# Patient Record
Sex: Male | Born: 1935 | Race: Black or African American | Hispanic: No | Marital: Married | Smoking: Never smoker
Health system: Southern US, Community
[De-identification: ages and names within clinical notes are randomized; demographics above are authoritative.]

## PROBLEM LIST (undated history)

## (undated) ENCOUNTER — Ambulatory Visit (HOSPITAL_COMMUNITY): Admission: EM | Discharge status: Absent without leave | Payer: Medicare Other

## (undated) DIAGNOSIS — G8929 Other chronic pain: Secondary | ICD-10-CM

## (undated) DIAGNOSIS — G459 Transient cerebral ischemic attack, unspecified: Secondary | ICD-10-CM

## (undated) DIAGNOSIS — M109 Gout, unspecified: Secondary | ICD-10-CM

## (undated) DIAGNOSIS — M545 Low back pain, unspecified: Secondary | ICD-10-CM

## (undated) DIAGNOSIS — E7800 Pure hypercholesterolemia, unspecified: Secondary | ICD-10-CM

## (undated) DIAGNOSIS — I10 Essential (primary) hypertension: Secondary | ICD-10-CM

## (undated) DIAGNOSIS — I499 Cardiac arrhythmia, unspecified: Secondary | ICD-10-CM

## (undated) DIAGNOSIS — I739 Peripheral vascular disease, unspecified: Secondary | ICD-10-CM

## (undated) DIAGNOSIS — R7303 Prediabetes: Secondary | ICD-10-CM

## (undated) DIAGNOSIS — J309 Allergic rhinitis, unspecified: Secondary | ICD-10-CM

## (undated) DIAGNOSIS — N529 Male erectile dysfunction, unspecified: Secondary | ICD-10-CM

## (undated) DIAGNOSIS — I509 Heart failure, unspecified: Secondary | ICD-10-CM

## (undated) DIAGNOSIS — I6529 Occlusion and stenosis of unspecified carotid artery: Secondary | ICD-10-CM

## (undated) DIAGNOSIS — E119 Type 2 diabetes mellitus without complications: Secondary | ICD-10-CM

## (undated) DIAGNOSIS — E876 Hypokalemia: Secondary | ICD-10-CM

## (undated) DIAGNOSIS — I5043 Acute on chronic combined systolic (congestive) and diastolic (congestive) heart failure: Secondary | ICD-10-CM

## (undated) DIAGNOSIS — M1990 Unspecified osteoarthritis, unspecified site: Secondary | ICD-10-CM

## (undated) DIAGNOSIS — I4891 Unspecified atrial fibrillation: Secondary | ICD-10-CM

## (undated) HISTORY — DX: Occlusion and stenosis of unspecified carotid artery: I65.29

## (undated) HISTORY — DX: Transient cerebral ischemic attack, unspecified: G45.9

## (undated) HISTORY — DX: Essential (primary) hypertension: I10

## (undated) HISTORY — DX: Male erectile dysfunction, unspecified: N52.9

## (undated) HISTORY — PX: KNEE ARTHROSCOPY: SHX127

## (undated) HISTORY — PX: TONSILLECTOMY: SUR1361

## (undated) HISTORY — DX: Hypokalemia: E87.6

## (undated) HISTORY — PX: CATARACT EXTRACTION W/ INTRAOCULAR LENS IMPLANT: SHX1309

## (undated) HISTORY — DX: Allergic rhinitis, unspecified: J30.9

## (undated) HISTORY — PX: INGUINAL HERNIA REPAIR: SUR1180

## (undated) HISTORY — DX: Pure hypercholesterolemia, unspecified: E78.00

## (undated) HISTORY — PX: LUMBAR DISC SURGERY: SHX700

## (undated) HISTORY — DX: Type 2 diabetes mellitus without complications: E11.9

## (undated) HISTORY — PX: BACK SURGERY: SHX140

---

## 2000-11-24 ENCOUNTER — Encounter: Payer: Self-pay | Admitting: Emergency Medicine

## 2000-11-24 ENCOUNTER — Emergency Department (HOSPITAL_COMMUNITY): Admission: EM | Admit: 2000-11-24 | Discharge: 2000-11-24 | Payer: Self-pay | Admitting: Emergency Medicine

## 2001-02-22 ENCOUNTER — Emergency Department (HOSPITAL_COMMUNITY): Admission: EM | Admit: 2001-02-22 | Discharge: 2001-02-22 | Payer: Self-pay | Admitting: Emergency Medicine

## 2001-06-01 ENCOUNTER — Encounter (HOSPITAL_BASED_OUTPATIENT_CLINIC_OR_DEPARTMENT_OTHER): Payer: Self-pay | Admitting: General Surgery

## 2001-06-05 ENCOUNTER — Encounter (INDEPENDENT_AMBULATORY_CARE_PROVIDER_SITE_OTHER): Payer: Self-pay | Admitting: *Deleted

## 2001-06-05 ENCOUNTER — Ambulatory Visit (HOSPITAL_COMMUNITY): Admission: RE | Admit: 2001-06-05 | Discharge: 2001-06-05 | Payer: Self-pay | Admitting: General Surgery

## 2004-04-23 ENCOUNTER — Ambulatory Visit: Payer: Self-pay | Admitting: Endocrinology

## 2005-03-18 ENCOUNTER — Ambulatory Visit: Payer: Self-pay | Admitting: Endocrinology

## 2005-04-20 ENCOUNTER — Ambulatory Visit: Payer: Self-pay | Admitting: Endocrinology

## 2005-05-10 ENCOUNTER — Ambulatory Visit: Payer: Self-pay | Admitting: Endocrinology

## 2005-06-21 ENCOUNTER — Ambulatory Visit: Payer: Self-pay | Admitting: Endocrinology

## 2005-10-12 ENCOUNTER — Ambulatory Visit: Payer: Self-pay | Admitting: Endocrinology

## 2006-07-19 ENCOUNTER — Ambulatory Visit: Payer: Self-pay | Admitting: Endocrinology

## 2007-01-25 ENCOUNTER — Encounter: Payer: Self-pay | Admitting: *Deleted

## 2007-01-25 DIAGNOSIS — J309 Allergic rhinitis, unspecified: Secondary | ICD-10-CM

## 2007-01-25 HISTORY — DX: Allergic rhinitis, unspecified: J30.9

## 2007-03-30 ENCOUNTER — Ambulatory Visit: Payer: Self-pay | Admitting: Endocrinology

## 2007-04-12 ENCOUNTER — Encounter: Payer: Self-pay | Admitting: Endocrinology

## 2007-04-12 ENCOUNTER — Ambulatory Visit: Payer: Self-pay

## 2007-04-12 DIAGNOSIS — I6529 Occlusion and stenosis of unspecified carotid artery: Secondary | ICD-10-CM | POA: Insufficient documentation

## 2007-04-12 HISTORY — DX: Occlusion and stenosis of unspecified carotid artery: I65.29

## 2007-04-19 ENCOUNTER — Ambulatory Visit: Payer: Self-pay | Admitting: Vascular Surgery

## 2007-04-19 ENCOUNTER — Encounter: Payer: Self-pay | Admitting: Endocrinology

## 2008-06-19 ENCOUNTER — Ambulatory Visit: Payer: Self-pay | Admitting: Endocrinology

## 2008-06-19 DIAGNOSIS — I10 Essential (primary) hypertension: Secondary | ICD-10-CM

## 2008-06-19 DIAGNOSIS — E7800 Pure hypercholesterolemia, unspecified: Secondary | ICD-10-CM

## 2008-06-19 DIAGNOSIS — N529 Male erectile dysfunction, unspecified: Secondary | ICD-10-CM

## 2008-06-19 HISTORY — DX: Male erectile dysfunction, unspecified: N52.9

## 2008-06-19 HISTORY — DX: Pure hypercholesterolemia, unspecified: E78.00

## 2008-06-19 HISTORY — DX: Essential (primary) hypertension: I10

## 2008-06-19 LAB — CONVERTED CEMR LAB
ALT: 13 U/L
AST: 17 U/L
Albumin: 3.1 g/dL — ABNORMAL LOW
Alkaline Phosphatase: 71 U/L
BUN: 11 mg/dL
Bilirubin, Direct: 0.1 mg/dL
CO2: 34 meq/L — ABNORMAL HIGH
Calcium: 9.1 mg/dL
Chloride: 105 meq/L
Cholesterol: 236 mg/dL
Creatinine, Ser: 0.9 mg/dL
Direct LDL: 139.3 mg/dL
GFR calc Af Amer: 106 mL/min
GFR calc non Af Amer: 88 mL/min
Glucose, Bld: 92 mg/dL
HDL: 30.9 mg/dL — ABNORMAL LOW
Potassium: 3.2 meq/L — ABNORMAL LOW
Sodium: 145 meq/L
TSH: 1.89 u[IU]/mL
Total Bilirubin: 0.8 mg/dL
Total CHOL/HDL Ratio: 7.6
Total Protein: 6.5 g/dL
Triglycerides: 193 mg/dL — ABNORMAL HIGH
VLDL: 39 mg/dL

## 2008-06-26 ENCOUNTER — Encounter: Payer: Self-pay | Admitting: Endocrinology

## 2008-06-26 DIAGNOSIS — G459 Transient cerebral ischemic attack, unspecified: Secondary | ICD-10-CM | POA: Insufficient documentation

## 2008-06-26 HISTORY — DX: Transient cerebral ischemic attack, unspecified: G45.9

## 2008-07-24 ENCOUNTER — Ambulatory Visit: Payer: Self-pay | Admitting: Endocrinology

## 2008-07-24 DIAGNOSIS — E876 Hypokalemia: Secondary | ICD-10-CM

## 2008-07-24 DIAGNOSIS — E119 Type 2 diabetes mellitus without complications: Secondary | ICD-10-CM

## 2008-07-24 HISTORY — DX: Hypokalemia: E87.6

## 2008-07-24 HISTORY — DX: Type 2 diabetes mellitus without complications: E11.9

## 2008-07-24 LAB — CONVERTED CEMR LAB
ALT: 15 units/L (ref 0–53)
AST: 19 units/L (ref 0–37)
Albumin: 3.5 g/dL (ref 3.5–5.2)
Alkaline Phosphatase: 74 units/L (ref 39–117)
BUN: 13 mg/dL (ref 6–23)
Bilirubin, Direct: 0.2 mg/dL (ref 0.0–0.3)
CO2: 35 meq/L — ABNORMAL HIGH (ref 19–32)
Calcium: 9.3 mg/dL (ref 8.4–10.5)
Chloride: 97 meq/L (ref 96–112)
Cholesterol: 184 mg/dL (ref 0–200)
Creatinine, Ser: 1.1 mg/dL (ref 0.4–1.5)
GFR calc Af Amer: 84 mL/min
GFR calc non Af Amer: 70 mL/min
Glucose, Bld: 88 mg/dL (ref 70–99)
HDL: 36.4 mg/dL — ABNORMAL LOW (ref >39.0)
Hgb A1c MFr Bld: 6.1 % — ABNORMAL HIGH (ref 4.6–6.0)
LDL Cholesterol: 121 mg/dL — ABNORMAL HIGH (ref 0–99)
PSA: 2.78 ng/mL (ref 0.10–4.00)
Potassium: 2.7 meq/L — CL (ref 3.5–5.1)
Sodium: 141 meq/L (ref 135–145)
Total Bilirubin: 1.1 mg/dL (ref 0.3–1.2)
Total CHOL/HDL Ratio: 5.1
Total Protein: 7.2 g/dL (ref 6.0–8.3)
Triglycerides: 132 mg/dL (ref 0–149)
VLDL: 26 mg/dL (ref 0–40)

## 2008-07-25 ENCOUNTER — Telehealth: Payer: Self-pay | Admitting: Endocrinology

## 2008-08-05 ENCOUNTER — Telehealth: Payer: Self-pay | Admitting: Endocrinology

## 2008-12-19 ENCOUNTER — Ambulatory Visit: Payer: Self-pay | Admitting: Endocrinology

## 2008-12-19 LAB — CONVERTED CEMR LAB
BUN: 14 mg/dL (ref 6–23)
Bilirubin Urine: NEGATIVE
CO2: 33 meq/L — ABNORMAL HIGH (ref 19–32)
Calcium: 9.1 mg/dL (ref 8.4–10.5)
Chloride: 96 meq/L (ref 96–112)
Creatinine, Ser: 1.1 mg/dL (ref 0.4–1.5)
Creatinine,U: 358.9 mg/dL
GFR calc non Af Amer: 84.09 mL/min (ref >60)
Glucose, Bld: 88 mg/dL (ref 70–99)
Hgb A1c MFr Bld: 6.3 % (ref 4.6–6.5)
Ketones, ur: NEGATIVE mg/dL
Microalb Creat Ratio: 71.1 mg/g — ABNORMAL HIGH (ref 0.0–30.0)
Microalb, Ur: 25.5 mg/dL — ABNORMAL HIGH (ref 0.0–1.9)
Nitrite: NEGATIVE
Potassium: 3.1 meq/L — ABNORMAL LOW (ref 3.5–5.1)
Sodium: 138 meq/L (ref 135–145)
Specific Gravity, Urine: >=1.03 (ref 1.000–1.030)
Total Protein, Urine: 100 mg/dL
Urine Glucose: NEGATIVE mg/dL
Urobilinogen, UA: 0.2 (ref 0.0–1.0)
pH: 6 (ref 5.0–8.0)

## 2009-10-02 ENCOUNTER — Ambulatory Visit: Payer: Self-pay | Admitting: Endocrinology

## 2009-10-02 LAB — CONVERTED CEMR LAB
ALT: 20 units/L (ref 0–53)
AST: 21 units/L (ref 0–37)
Albumin: 3.9 g/dL (ref 3.5–5.2)
Alkaline Phosphatase: 78 units/L (ref 39–117)
BUN: 10 mg/dL (ref 6–23)
Basophils Absolute: 0 10*3/uL (ref 0.0–0.1)
Basophils Relative: 0.5 % (ref 0.0–3.0)
Bilirubin, Direct: 0.2 mg/dL (ref 0.0–0.3)
CO2: 33 meq/L — ABNORMAL HIGH (ref 19–32)
Calcium: 9.7 mg/dL (ref 8.4–10.5)
Chloride: 99 meq/L (ref 96–112)
Cholesterol: 262 mg/dL — ABNORMAL HIGH (ref 0–200)
Creatinine, Ser: 1.1 mg/dL (ref 0.4–1.5)
Direct LDL: 157.8 mg/dL
Eosinophils Absolute: 0.1 10*3/uL (ref 0.0–0.7)
Eosinophils Relative: 1.8 % (ref 0.0–5.0)
GFR calc non Af Amer: 83.91 mL/min (ref >60)
Glucose, Bld: 94 mg/dL (ref 70–99)
HCT: 47.3 % (ref 39.0–52.0)
HDL: 38.2 mg/dL — ABNORMAL LOW (ref >39.00)
Hemoglobin: 15.9 g/dL (ref 13.0–17.0)
Hgb A1c MFr Bld: 6 % (ref 4.6–6.5)
Lymphocytes Relative: 33.2 % (ref 12.0–46.0)
Lymphs Abs: 2.2 10*3/uL (ref 0.7–4.0)
MCHC: 33.6 g/dL (ref 30.0–36.0)
MCV: 83.5 fL (ref 78.0–100.0)
Monocytes Absolute: 0.6 10*3/uL (ref 0.1–1.0)
Monocytes Relative: 8.6 % (ref 3.0–12.0)
Neutro Abs: 3.6 10*3/uL (ref 1.4–7.7)
Neutrophils Relative %: 55.9 % (ref 43.0–77.0)
PSA: 3.55 ng/mL (ref 0.10–4.00)
Platelets: 196 10*3/uL (ref 150.0–400.0)
Potassium: 3.5 meq/L (ref 3.5–5.1)
RBC: 5.67 M/uL (ref 4.22–5.81)
RDW: 15.9 % — ABNORMAL HIGH (ref 11.5–14.6)
Sodium: 140 meq/L (ref 135–145)
TSH: 1.36 microintl units/mL (ref 0.35–5.50)
Total Bilirubin: 1 mg/dL (ref 0.3–1.2)
Total CHOL/HDL Ratio: 7
Total Protein: 6.9 g/dL (ref 6.0–8.3)
Triglycerides: 193 mg/dL — ABNORMAL HIGH (ref 0.0–149.0)
VLDL: 38.6 mg/dL (ref 0.0–40.0)
WBC: 6.5 10*3/uL (ref 4.5–10.5)

## 2010-07-07 NOTE — Assessment & Plan Note (Signed)
Summary: f/u appt per wife/#/cd   Vital Signs:  Patient profile:   77 year old male Height:      65 inches (165.10 cm) Weight:      200.50 pounds (91.14 kg) BMI:     33.49 O2 Sat:      96 % on Room air Temp:     97.2 degrees F (36.22 degrees C) oral Pulse rate:   99 / minute BP sitting:   102 / 60  (left arm) Cuff size:   large  Vitals Entered By: Josph Macho RMA (October 02, 2009 1:33 PM)  O2 Flow:  Room air CC: Follow-up visit/ Pt states he is no longer taking Viagra, Simvastatin, or Klor Con/ CF Is Patient Diabetic? Yes   CC:  Follow-up visit/ Pt states he is no longer taking Viagra, Simvastatin, and or Klor Con/ CF.  History of Present Illness: the status of at least 3 ongoing medical problems is addressed today: hypokalemia:  pt does not take klor.  denies cramps. dm:  he does not check cbg's.  pt states he feels well in general. dyslipidemia:  he does not take his zocor.  he is working on his diet. htn:  he takes and tolerates zestoretic well.  Current Medications (verified): 1)  Zestoretic 20-12.5 Mg Tabs (Lisinopril-Hydrochlorothiazide) .... Take 2 By Mouth Once Daily 2)  Viagra 100 Mg Tabs (Sildenafil Citrate) .... Prn 3)  Simvastatin 80 Mg Tabs (Simvastatin) .... Qhs 4)  Klor-Con M20 20 Meq Cr-Tabs (Potassium Chloride Crys Cr) .... Qd 5)  Aspirin 81mg  .... Once Daily  Allergies (verified): 1)  ! 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    Physical Exam  General:  normal appearance.   Lungs:  Clear to auscultation bilaterally. Normal respiratory effort.  Heart:  Regular rate and rhythm without murmurs or gallops noted. Normal S1,S2.     Additional Exam:  Potassium                 3.5 mEq/L  Hemoglobin A1C            6.0 %                       4.6-6.5 Cholesterol LDL   157.8 mg/dL   Impression & Recommendations:  Problem # 1:  DM (ICD-250.00) well-controlled  Problem # 2:  HYPOKALEMIA (ICD-276.8) reduction of lisinopril/hctz will help this  Problem # 3:  HYPERCHOLESTEROLEMIA (ICD-272.0) needs increased rx  Problem # 4:  HYPERTENSION (ICD-401.9) overcontrolled  Medications Added to Medication List This Visit: 1)  Zestoretic 20-12.5 Mg Tabs (Lisinopril-hydrochlorothiazide) .... Take 1 by mouth once daily 2)  Aspirin 81mg   .... Once daily  Other Orders: TLB-Lipid Panel (80061-LIPID) TLB-BMP (Basic Metabolic Panel-BMET) (80048-METABOL) TLB-CBC Platelet - w/Differential (85025-CBCD) TLB-Hepatic/Liver Function Pnl (80076-HEPATIC) TLB-TSH (Thyroid Stimulating Hormone) (84443-TSH) TLB-A1C / Hgb A1C (Glycohemoglobin) (83036-A1C) TLB-PSA (Prostate Specific Antigen) (84153-PSA) Est. Patient Level IV (16109) Prescription Created Electronically [phone])  Patient Instructions: 1)  tests are being ordered for you today.  a few days after the test(s), please call [phone] to hear your test results. 2)  pending the test results, please reduce lisinopril/hctz to 1 pill/day.  also, you should resume simvastatin 80 mg once daily. 3)  please schedule a physical  appointment soon. 4)  (update: i left message on phone-tree:  resume zocor). Prescriptions: SIMVASTATIN 80 MG TABS (SIMVASTATIN) qhs  #30 x 11   Entered and Authorized by:   Minus Breeding MD   Signed by:   Minus Breeding MD on 10/02/2009   Method used:   Electronically to        [facility] [phone]* (retail)       [address]       [place], [place]  [address]       Ph: [phone]       Fax: [phone]   RxID:   [id number] ZESTORETIC 20-12.5 MG TABS (LISINOPRIL-HYDROCHLOROTHIAZIDE) take 1 by mouth once daily  #30 x 11   Entered and Authorized  by:   Minus Breeding MD   Signed by:   Minus Breeding MD on 10/02/2009   Method used:   Electronically to        [facility] [phone]* (retail)       [address]       [place], [place]  [address]       Ph: [phone]       Fax: [phone]   RxID:   [id number]

## 2010-10-20 NOTE — Assessment & Plan Note (Signed)
OFFICE VISIT   Lucas [name], Lucas [name]  DOB:  09/10/1932                                       04/19/2007  ZOXWR#:[id number]   The patient is a 77 year old male referred by Dr. Romero Belling for  evaluation of asymptomatic carotid stenosis.  His atherosclerotic risk  factors include hypertension, elevated cholesterol, and smoking.  He  quit smoking 7 years ago.   He does have a history of a TIA in 1998 where he had a left hemiplegia,  which lasted approximately 5 seconds.  He has had no further events  since then.  He denies any history of amaurosis.   Denies history of coronary artery disease or diabetes.   PAST SURGICAL HISTORY:  He had a left knee operation, back surgery,  prostatectomy, and ventral hernia repair.   PAST MEDICAL HISTORY:  Otherwise, fairly unremarkable.   Medications include aspirin once a day, lisinopril hydrochlorothiazide  20/12.5 two daily, Pravachol 40 mg 2 p.o. nightly.   He is allergic to sulfa, which causes nausea.   FAMILY HISTORY:  Remarkable for his brother who had kidney problems and  his father who had a stroke.   SOCIAL HISTORY:  He is married.  Smoking history is as above.  He does  not consume alcohol regularly.   REVIEW OF SYSTEMS:  He has had some recent weight gain.  He is 5 feet 6  inches.  He had an irregular heartbeat when he was little, but has not had  problems with this since then.  He denies history of chest pain, asthma, wheezing, GI bleeding, renal  dysfunction, syncopal episodes, seizures, depression, anxiety, changes  in his eyesight, or bleeding or clotting disorders.  TIA history as listed above.   PHYSICAL EXAM:  Blood pressure 124/78 in the right arm, 153/85 in the  left arm, heart rate is 98 and regular.  HEENT is unremarkable.  He has  a left carotid bruit.  He has no right carotid bruit.  He has 2+ carotid  pulses.  He has 2+ brachial, radial, femoral, and dorsalis pedis pulses  bilaterally.   Chest is clear to auscultation.  Cardiac exam is regular  rate and rhythm without murmur.  Abdomen is obese, soft, nontender,  nondistended with normal bowel sounds.  Neurologic exam shows symmetric  upper extremity and lower extremity motor strength.  Sensation is  intact.   The patient had a carotid duplex exam performed on April 12, 2007 at  [facility].  This showed a high-grade left internal carotid  artery stenosis greater than 80% with an end diastolic velocity of 227  cm per second.  He had less than 40% stenosis of the right internal  carotid artery. Vertebral artery flow was antegrade bilaterally.   I believe the patient would benefit form left carotid endarterectomy for  stroke prophylaxis.  I discussed with him today the risks, benefits, and  possible complications, and procedure details of carotid endarterectomy.  I also discussed with him the stroke risk long term of medical therapy  alone versus stroke risk of carotid endarterectomy and the advantages of  this.  However, he does not wish to have carotid endarterectomy  scheduled at this time.  He states he would like to discuss this with  his primary care doctor first.  We can schedule him for his carotid  endarterectomy  at any point in the future.  He did have a cardiac stress  test within the last few weeks.  We would need to obtain this prior to  his carotid endarterectomy and make sure that he has no significant  ischemia.   Janetta Hora. Fields, MD  Electronically Signed   CEF/MEDQ  D:  04/19/2007  T:  04/20/2007  Job:  [id number]   cc:   Gregary Signs A. Everardo All, MD

## 2010-10-23 ENCOUNTER — Other Ambulatory Visit: Payer: Self-pay | Admitting: Endocrinology

## 2010-10-23 NOTE — Op Note (Signed)
[place]  Patient:    STAVROS, CAIL Visit Number: [id number] MRN: [id number]          Service Type: Attending:  Luisa Hart L. Lurene Shadow, M.D. Dictated by:   Mardene Celeste. Lurene Shadow, M.D. Proc. Date: 06/05/01                             Operative Report  PREOPERATIVE DIAGNOSIS:  Right inguinal hernia with scrotal component.  POSTOPERATIVE DIAGNOSIS:  Right inguinal hernia with scrotal component.  OPERATION PERFORMED:  Right inguinal herniorrhaphy with mesh.  SURGEON:  Mardene Celeste. Lurene Shadow, M.D.  ASSISTANT:  Nurse.  ANESTHESIA:  General.  INDICATIONS FOR PROCEDURE:  The patient is a 77 year old man presenting with a very large inguinal hernia which has extended down into the scrotum.  This hernia is however, reducible with some effort.  He was brought to the operating room for repair.  DESCRIPTION OF PROCEDURE:  Following the induction of satisfactory general anesthesia with the patient positioned supinely, the lower abdomen, scrotum, penis were prepped and draped to be included in a sterile operative field.  I infiltrated the right lower abdominal crease with 0.5% Marcaine with epinephrine and made a transverse incision in the lower abdominal crease carrying the dissection down to the external oblique aponeurosis.  Additional injections of Marcaine 0.5% with 1:200,000 epinephrine were used throughout the course of the procedure.  I opened up the external oblique aponeurosis with the external inguinal ring and retracted the ilioinguinal nerve cephalad and medially.  The entire spermatic cord and large sac were then elevated and held with a Penrose drain.  Dissection of the very large sac from out of the scrotum was carried out separating the sac from the cord and carrying the dissection out to the internal ring.  The sac was then doubly suture ligated with 2-0 silk sutures and amputated and forwarded for pathologic evaluation. The direct space was  then repaired with an onlay patch of Marlex mesh which was then sewn into the pubic tubercle and carried up along the conjoined tendon with a running 2-0 Novofil suture up to the internal ring and again from the pubic tubercle up along the shelving edge of Pouparts ligament up to the internal ring with the mesh being split so as to allow normal protrusion of the cord through the mesh.  The tails of the mesh were then trimmed and sutured down to internal oblique muscles.  The repair appeared to be intact. Sponge, instrument and sharp counts were verified.  The ilioinguinal nerve returned to its normal anatomic position and the external oblique aponeurosis closed over the spermatic cord with running 2-0 Vicryl suture.  Subcutaneous tissue were closed with a running 3-0 Vicryl suture and the skin was closed with a running 4-0 Monocryl and then reinforced with Steri-Strips.  Sterile dressing was applied.  Anesthetic reversed.  Patient removed from the operating room to the recovery room in stable condition having tolerated the procedure well. Dictated by:   Mardene Celeste. Lurene Shadow, M.D. Attending:  Mardene Celeste. Lurene Shadow, M.D. DD:  06/05/01 TD:  06/05/01 Job: [id number]

## 2010-12-10 ENCOUNTER — Other Ambulatory Visit: Payer: Self-pay | Admitting: Endocrinology

## 2011-02-08 ENCOUNTER — Other Ambulatory Visit: Payer: Self-pay | Admitting: Endocrinology

## 2011-03-01 ENCOUNTER — Ambulatory Visit (INDEPENDENT_AMBULATORY_CARE_PROVIDER_SITE_OTHER): Payer: Medicare Other | Admitting: Endocrinology

## 2011-03-01 ENCOUNTER — Other Ambulatory Visit (INDEPENDENT_AMBULATORY_CARE_PROVIDER_SITE_OTHER): Payer: Medicare Other

## 2011-03-01 ENCOUNTER — Other Ambulatory Visit: Payer: Self-pay

## 2011-03-01 ENCOUNTER — Encounter: Payer: Self-pay | Admitting: Endocrinology

## 2011-03-01 ENCOUNTER — Other Ambulatory Visit: Payer: Self-pay | Admitting: Endocrinology

## 2011-03-01 DIAGNOSIS — E7800 Pure hypercholesterolemia, unspecified: Secondary | ICD-10-CM

## 2011-03-01 DIAGNOSIS — Z125 Encounter for screening for malignant neoplasm of prostate: Secondary | ICD-10-CM

## 2011-03-01 DIAGNOSIS — R82998 Other abnormal findings in urine: Secondary | ICD-10-CM

## 2011-03-01 DIAGNOSIS — E119 Type 2 diabetes mellitus without complications: Secondary | ICD-10-CM

## 2011-03-01 DIAGNOSIS — Z79899 Other long term (current) drug therapy: Secondary | ICD-10-CM

## 2011-03-01 DIAGNOSIS — I10 Essential (primary) hypertension: Secondary | ICD-10-CM

## 2011-03-01 DIAGNOSIS — E876 Hypokalemia: Secondary | ICD-10-CM

## 2011-03-01 DIAGNOSIS — T887XXA Unspecified adverse effect of drug or medicament, initial encounter: Secondary | ICD-10-CM | POA: Insufficient documentation

## 2011-03-01 DIAGNOSIS — G459 Transient cerebral ischemic attack, unspecified: Secondary | ICD-10-CM

## 2011-03-01 DIAGNOSIS — R8281 Pyuria: Secondary | ICD-10-CM

## 2011-03-01 LAB — CBC WITH DIFFERENTIAL/PLATELET
Eosinophils Absolute: 0.1 10*3/uL (ref 0.0–0.7)
Eosinophils Relative: 1.5 % (ref 0.0–5.0)
Lymphocytes Relative: 31.6 % (ref 12.0–46.0)
MCV: 85.1 fl (ref 78.0–100.0)
Monocytes Absolute: 0.7 10*3/uL (ref 0.1–1.0)
Neutrophils Relative %: 55.8 % (ref 43.0–77.0)
Platelets: 162 10*3/uL (ref 150.0–400.0)
RBC: 5.63 Mil/uL (ref 4.22–5.81)
WBC: 6.2 10*3/uL (ref 4.5–10.5)

## 2011-03-01 LAB — URINALYSIS, ROUTINE W REFLEX MICROSCOPIC
Nitrite: NEGATIVE
Specific Gravity, Urine: 1.02 (ref 1.000–1.030)
Total Protein, Urine: NEGATIVE
Urobilinogen, UA: 0.2 (ref 0.0–1.0)

## 2011-03-01 LAB — HEPATIC FUNCTION PANEL
ALT: 15 U/L (ref 0–53)
Albumin: 3.6 g/dL (ref 3.5–5.2)
Bilirubin, Direct: 0.1 mg/dL (ref 0.0–0.3)
Total Protein: 6.6 g/dL (ref 6.0–8.3)

## 2011-03-01 LAB — MICROALBUMIN / CREATININE URINE RATIO
Creatinine,U: 249.2 mg/dL
Microalb Creat Ratio: 0.8 mg/g (ref 0.0–30.0)
Microalb, Ur: 1.9 mg/dL (ref 0.0–1.9)

## 2011-03-01 LAB — BASIC METABOLIC PANEL
Calcium: 8.8 mg/dL (ref 8.4–10.5)
Creatinine, Ser: 0.8 mg/dL (ref 0.4–1.5)
GFR: 124.3 mL/min (ref >60.00)

## 2011-03-01 LAB — LIPID PANEL
Total CHOL/HDL Ratio: 6
VLDL: 38.4 mg/dL (ref 0.0–40.0)

## 2011-03-01 LAB — HEMOGLOBIN A1C: Hgb A1c MFr Bld: 6 % (ref 4.6–6.5)

## 2011-03-01 LAB — TSH: TSH: 1.28 u[IU]/mL (ref 0.35–5.50)

## 2011-03-01 LAB — LDL CHOLESTEROL, DIRECT: Direct LDL: 146.8 mg/dL

## 2011-03-01 MED ORDER — SIMVASTATIN 80 MG PO TABS: 80.0000 mg | ORAL_TABLET | Freq: Every day | ORAL | Status: DC

## 2011-03-01 MED ORDER — LISINOPRIL-HYDROCHLOROTHIAZIDE 20-12.5 MG PO TABS: 1.0000 | ORAL_TABLET | Freq: Every day | ORAL | Status: DC

## 2011-03-01 NOTE — Patient Instructions (Addendum)
Please schedule a "medicare wellness" appointment. blood tests are being requested for you today.  please call [phone] to hear your test results.  You will be prompted to enter the 9-digit "MRN" number that appears at the top left of this page, followed by #.  Then you will hear the message. Please make a follow-up appointment in 6 months. (update: i left message on phone-tree:  Resume zocor.  i requested urine c/s).

## 2011-03-01 NOTE — Progress Notes (Signed)
  Subjective:    Patient ID: Lucas [name], male    DOB: 09/10/1932, 77 y.o.   MRN: [id number]  HPI The state of at least three ongoing medical problems is addressed today: Htn: he he takes med as rx'ed.  He refuses ecg. Dyslipidemia:  He does not take zocor.  Denies chest pain. Dm: he says his diet is good.  No weight change Past Medical History  Diagnosis Date  . DM 07/24/2008  . HYPERCHOLESTEROLEMIA 06/19/2008  . HYPOKALEMIA 07/24/2008  . HYPERTENSION 06/19/2008  . 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 TIA 06/26/2008  . ALLERGIC RHINITIS 01/25/2007    No past surgical history on file.  History   Social History  . Marital Status: Married    Spouse Name: N/A    Number of Children: N/A  . Years of Education: N/A   Occupational History  . Not on file.   Social History Main Topics  . Smoking status: Never Smoker   . Smokeless tobacco: Not on file  . Alcohol Use: Not on file  . Drug Use: Not on file  . Sexually Active: Not on file   Other Topics Concern  . Not on file   Social History Narrative  . No narrative on file    Current Outpatient Prescriptions on File Prior to Visit  Medication Sig Dispense Refill  . aspirin 81 MG tablet Take 81 mg by mouth daily.        . sildenafil (VIAGRA) 100 MG tablet Take 100 mg by mouth as needed.          Allergies  Allergen Reactions  . Sulfonamide Derivatives     No family history on file.  BP 142/86  Pulse 90  Temp(Src) 97.9 F (36.6 C) (Oral)  Ht 5\' 7"  (1.702 m)  Wt 211 lb 6.4 oz (95.89 kg)  BMI 33.11 kg/m2  SpO2 97%    Review of Systems Denies sob and headache    Objective:   Physical Exam VITAL SIGNS:  See vs page GENERAL: no distress Pulses: dorsalis pedis intact bilat.   Feet: no deformity.  no ulcer on the feet.  feet are of normal color and temp.  no edema Neuro: sensation is intact to touch on the feet    Lab Results  Component  Value Date   WBC 6.2 03/01/2011   HGB 15.8 03/01/2011   HCT 48.0 03/01/2011   PLT 162.0 03/01/2011   CHOL 239* 03/01/2011   TRIG 192.0* 03/01/2011   HDL 39.90 03/01/2011   LDLDIRECT 146.8 03/01/2011   ALT 15 03/01/2011   AST 18 03/01/2011   NA 142 03/01/2011   K 3.5 03/01/2011   CL 100 03/01/2011   CREATININE 0.8 03/01/2011   BUN 13 03/01/2011   CO2 31 03/01/2011   TSH 1.28 03/01/2011   PSA 3.68 03/01/2011   HGBA1C 6.0 03/01/2011   MICROALBUR 1.9 03/01/2011      Assessment & Plan:  Dyslipidemia, therapy limited by noncompliance.  i'll do the best i can. Dm, well-controlled Pyuria, uncertain etiology.  New. Htn, with ? Of situational component

## 2011-03-08 ENCOUNTER — Telehealth: Payer: Self-pay | Admitting: *Deleted

## 2011-03-08 NOTE — Telephone Encounter (Signed)
Yes, please.

## 2011-03-08 NOTE — Telephone Encounter (Signed)
Clydie Braun from lab called. They were unable to complete urine c/s. Do you want pt to come to give another sample for urine c/s?

## 2011-03-08 NOTE — Telephone Encounter (Signed)
Pt's wife states that pt will come in tomorrow for urine c/s.

## 2011-03-10 ENCOUNTER — Other Ambulatory Visit: Payer: Medicare Other

## 2011-03-10 DIAGNOSIS — R8281 Pyuria: Secondary | ICD-10-CM

## 2011-03-12 LAB — CULTURE, URINE COMPREHENSIVE
Colony Count: NO GROWTH
Organism ID, Bacteria: NO GROWTH

## 2012-03-15 ENCOUNTER — Other Ambulatory Visit: Payer: Self-pay | Admitting: Endocrinology

## 2012-03-15 ENCOUNTER — Encounter: Payer: Self-pay | Admitting: Endocrinology

## 2012-03-15 ENCOUNTER — Ambulatory Visit (INDEPENDENT_AMBULATORY_CARE_PROVIDER_SITE_OTHER): Payer: Medicare Other | Admitting: Endocrinology

## 2012-03-15 ENCOUNTER — Ambulatory Visit: Payer: Medicare Other | Admitting: Endocrinology

## 2012-03-15 VITALS — BP 142/80 | HR 74 | Temp 98.3°F | Wt 201.0 lb

## 2012-03-15 DIAGNOSIS — Z79899 Other long term (current) drug therapy: Secondary | ICD-10-CM

## 2012-03-15 DIAGNOSIS — Z125 Encounter for screening for malignant neoplasm of prostate: Secondary | ICD-10-CM | POA: Diagnosis not present

## 2012-03-15 DIAGNOSIS — I10 Essential (primary) hypertension: Secondary | ICD-10-CM | POA: Diagnosis not present

## 2012-03-15 DIAGNOSIS — E119 Type 2 diabetes mellitus without complications: Secondary | ICD-10-CM | POA: Diagnosis not present

## 2012-03-15 DIAGNOSIS — Z0000 Encounter for general adult medical examination without abnormal findings: Secondary | ICD-10-CM

## 2012-03-15 DIAGNOSIS — R972 Elevated prostate specific antigen [PSA]: Secondary | ICD-10-CM

## 2012-03-15 DIAGNOSIS — E876 Hypokalemia: Secondary | ICD-10-CM

## 2012-03-15 DIAGNOSIS — I6529 Occlusion and stenosis of unspecified carotid artery: Secondary | ICD-10-CM

## 2012-03-15 DIAGNOSIS — E7800 Pure hypercholesterolemia, unspecified: Secondary | ICD-10-CM | POA: Diagnosis not present

## 2012-03-15 DIAGNOSIS — N390 Urinary tract infection, site not specified: Secondary | ICD-10-CM | POA: Diagnosis not present

## 2012-03-15 LAB — BASIC METABOLIC PANEL
BUN: 10 mg/dL (ref 6–23)
Chloride: 98 mEq/L (ref 96–112)
Glucose, Bld: 87 mg/dL (ref 70–99)
Potassium: 3.3 mEq/L — ABNORMAL LOW (ref 3.5–5.3)

## 2012-03-15 LAB — HEPATIC FUNCTION PANEL
AST: 17 U/L (ref 0–37)
Albumin: 4.1 g/dL (ref 3.5–5.2)
Alkaline Phosphatase: 78 U/L (ref 39–117)
Total Protein: 6.8 g/dL (ref 6.0–8.3)

## 2012-03-15 LAB — CBC WITH DIFFERENTIAL/PLATELET
Eosinophils Absolute: 0.1 10*3/uL (ref 0.0–0.7)
Eosinophils Relative: 2 % (ref 0–5)
Hemoglobin: 16 g/dL (ref 13.0–17.0)
Lymphs Abs: 2.1 10*3/uL (ref 0.7–4.0)
MCH: 27.4 pg (ref 26.0–34.0)
MCV: 78.8 fL (ref 78.0–100.0)
Monocytes Absolute: 0.6 10*3/uL (ref 0.1–1.0)
Monocytes Relative: 12 % (ref 3–12)
RBC: 5.85 MIL/uL — ABNORMAL HIGH (ref 4.22–5.81)

## 2012-03-15 LAB — TSH: TSH: 1.591 u[IU]/mL (ref 0.350–4.500)

## 2012-03-15 LAB — LIPID PANEL: Total CHOL/HDL Ratio: 7.8 Ratio

## 2012-03-15 MED ORDER — LISINOPRIL-HYDROCHLOROTHIAZIDE 20-12.5 MG PO TABS: ORAL_TABLET | ORAL | Status: DC

## 2012-03-15 NOTE — Progress Notes (Signed)
Subjective:    Patient ID: Lucas [name], male    DOB: 09/10/1932, 78 y.o.   MRN: [id number]  HPI The state of at least three ongoing medical problems is addressed today: elev psa is noted: denies decreased urinary stream Dyslipidemia: he does not take zocor.  Denies weight change.  DM: denies numbness Past Medical History  Diagnosis Date  . DM 07/24/2008  . HYPERCHOLESTEROLEMIA 06/19/2008  . HYPOKALEMIA 07/24/2008  . HYPERTENSION 06/19/2008  . 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 TIA 06/26/2008  . ALLERGIC RHINITIS 01/25/2007    No past surgical history on file.  History   Social History  . Marital Status: Married    Spouse Name: N/A    Number of Children: N/A  . Years of Education: N/A   Occupational History  . Not on file.   Social History Main Topics  . Smoking status: Never Smoker   . Smokeless tobacco: Not on file  . Alcohol Use: Not on file  . Drug Use: Not on file  . Sexually Active: Not on file   Other Topics Concern  . Not on file   Social History Narrative  . No narrative on file    Current Outpatient Prescriptions on File Prior to Visit  Medication Sig Dispense Refill  . aspirin 81 MG tablet Take 81 mg by mouth daily.        Marland Kitchen lisinopril-hydrochlorothiazide (PRINZIDE,ZESTORETIC) 20-12.5 MG per tablet 1 1/2 tabs, once a day  150 tablet  3  . sildenafil (VIAGRA) 100 MG tablet Take 100 mg by mouth as needed.        . simvastatin (ZOCOR) 80 MG tablet Take 1 tablet (80 mg total) by mouth at bedtime.  30 tablet  11    Allergies  Allergen Reactions  . Sulfonamide Derivatives     No family history on file.  BP 142/80  Pulse 74  Temp 98.3 F (36.8 C) (Oral)  Wt 201 lb (91.173 kg)  Review of Systems Denies chest pain, cough, and sob    Objective:   Physical Exam VITAL SIGNS:  See vs page GENERAL: no distress NECK: There is no palpable thyroid enlargement.  No thyroid nodule  is palpable.  No palpable lymphadenopathy at the anterior neck. LUNGS:  Clear to auscultation HEART:  Regular rate and rhythm without murmurs noted. Normal S1,S2.   Pulses: dorsalis pedis intact bilat.  There is a left carotid bruit. Feet: no deformity.  no ulcer on the feet.  feet are of normal color and temp.  no edema Neuro: sensation is intact to touch on the feet  Lab Results  Component Value Date   WBC 5.0 03/15/2012   HGB 16.0 03/15/2012   HCT 46.1 03/15/2012   PLT 193 03/15/2012   GLUCOSE 87 03/15/2012   CHOL 259* 03/15/2012   TRIG 209* 03/15/2012   HDL 33* 03/15/2012   LDLDIRECT 146.8 03/01/2011   LDLCALC 184* 03/15/2012   ALT 12 03/15/2012   AST 17 03/15/2012   NA 141 03/15/2012   K 3.3* 03/15/2012   CL 98 03/15/2012   CREATININE 0.90 03/15/2012   BUN 10 03/15/2012   CO2 33* 03/15/2012   TSH 1.591 03/15/2012   PSA 5.47* 03/15/2012   HGBA1C 6.0* 03/15/2012   MICROALBUR 3.05* 03/15/2012   ecg is refused    Assessment & Plan:  elev psa, new Dyslipidemia: needs increased rx DM, well-controlled    Subjective:   Patient  here for Medicare annual wellness visit and management of other chronic and acute problems.     Risk factors: advanced age    Roster of Physicians Providing Medical Care to Patient:  See "snapshot"   Activities of Daily Living: In your present state of health, do you have any difficulty performing the following activities?:  Preparing food and eating?: No  Bathing yourself: No  Getting dressed: No  Using the toilet:No  Moving around from place to place: No  In the past year have you fallen or had a near fall?: No    Home Safety: Has smoke detector and wears seat belts. No firearms.  Diet and Exercise  Current exercise habits: pt says good Dietary issues discussed: pt reports a healthy diet   Depression Screen  Q1: Over the past two weeks, have you felt down, depressed or hopeless?no  Q2: Over the past two weeks, have you felt little interest or pleasure  in doing things? no   The following portions of the patient's history were reviewed and updated as appropriate: allergies, current medications, past family history, past medical history, past social history, past surgical history and problem list.  Past Medical History  Diagnosis Date  . DM 07/24/2008  . HYPERCHOLESTEROLEMIA 06/19/2008  . HYPOKALEMIA 07/24/2008  . HYPERTENSION 06/19/2008  . 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 TIA 06/26/2008  . ALLERGIC RHINITIS 01/25/2007    No past surgical history on file.  History   Social History  . Marital Status: Married    Spouse Name: N/A    Number of Children: N/A  . Years of Education: N/A   Occupational History  . Not on file.   Social History Main Topics  . Smoking status: Never Smoker   . Smokeless tobacco: Not on file  . Alcohol Use: Not on file  . Drug Use: Not on file  . Sexually Active: Not on file   Other Topics Concern  . Not on file   Social History Narrative  . No narrative on file    Current Outpatient Prescriptions on File Prior to Visit  Medication Sig Dispense Refill  . aspirin 81 MG tablet Take 81 mg by mouth daily.        . sildenafil (VIAGRA) 100 MG tablet Take 100 mg by mouth as needed.        . simvastatin (ZOCOR) 80 MG tablet Take 1 tablet (80 mg total) by mouth at bedtime.  30 tablet  11  . DISCONTD: lisinopril-hydrochlorothiazide (PRINZIDE,ZESTORETIC) 20-12.5 MG per tablet Take 1 tablet by mouth daily.  90 tablet  3    Allergies  Allergen Reactions  . Sulfonamide Derivatives     No family history on file.  BP 142/80  Pulse 74  Temp 98.3 F (36.8 C) (Oral)  Wt 201 lb (91.173 kg)   Review of Systems  Denies hearing loss, and visual loss Objective:   Vision:  Sees opthalmologist Hearing: grossly normal Body mass index:  See vs page Msk: pt easily and quickly performs "get-up-and-go" from a sitting position Cognitive  Impairment Assessment: cognition, memory and judgment appear normal.  remembers 3/3 at 5 minutes.  excellent recall.  He declines to read and write a sentence.  alert and oriented x 3.     Assessment:   Medicare wellness utd on preventive parameters    Plan:   During the course of the visit the patient was educated and counseled about appropriate screening  and preventive services including:        Fall prevention    Diabetes screening  Nutrition counseling   Vaccines / LABS Zostavax / Pnemonccoal Vaccine  refused  Patient Instructions (the written plan) was given to the patient.

## 2012-03-15 NOTE — Patient Instructions (Addendum)
good diet and exercise habits significanly improve the control of your diabetes.  please let me know if you wish to be referred to a dietician.  high blood sugar is very risky to your health.  you should see an eye doctor every year.  You are at higher than average risk for pneumonia and hepatitis-B.  You should be vaccinated against both.   please consider these measures for your health:  minimize alcohol.  do not use tobacco products.  have a colonoscopy at least every 10 years from age 65.  keep firearms safely stored.  always use seat belts.  have working smoke alarms in your home.  see an eye doctor and dentist regularly.  never drive under the influence of alcohol or drugs (including prescription drugs).   please let me know what your wishes would be, if artificial life support measures should become necessary.  it is critically important to prevent falling down (keep floor areas well-lit, dry, and free of loose objects.  If you have a cane, walker, or wheelchair, you should use it, even for short trips around the house.  Also, try not to rush) Please come back for a follow-up appointment in 6 months. You should have a vaccine against shingles (a painful rash which results from the  chickenpox infection which most people had many years ago).  This vaccine reduces, but does not totally eliminate the risk of shingles.  Because this is a medicare part d benefit, you should get it at a pharmacy.    (update: we discussed code status.  pt requests full code, but would not want to be started or maintained on artificial life-support measures if there was not a reasonable chance of recovery)

## 2012-03-16 DIAGNOSIS — R972 Elevated prostate specific antigen [PSA]: Secondary | ICD-10-CM | POA: Insufficient documentation

## 2012-03-16 LAB — URINALYSIS, ROUTINE W REFLEX MICROSCOPIC
Glucose, UA: NEGATIVE mg/dL
Protein, ur: NEGATIVE mg/dL
pH: 6.5 (ref 5.0–8.0)

## 2012-03-16 LAB — MICROALBUMIN / CREATININE URINE RATIO: Microalb, Ur: 3.05 mg/dL — ABNORMAL HIGH (ref 0.00–1.89)

## 2012-03-16 LAB — URINALYSIS, MICROSCOPIC ONLY
Bacteria, UA: NONE SEEN
Casts: NONE SEEN
Crystals: NONE SEEN

## 2012-03-16 MED ORDER — CIPROFLOXACIN HCL 500 MG PO TABS: 500.0000 mg | ORAL_TABLET | Freq: Two times a day (BID) | ORAL | Status: DC

## 2012-03-17 ENCOUNTER — Telehealth: Payer: Self-pay | Admitting: Endocrinology

## 2012-03-17 LAB — URINE CULTURE: Colony Count: NO GROWTH

## 2012-03-17 NOTE — Telephone Encounter (Signed)
Pt's wife advised pt should be taking cipro for uti

## 2012-03-17 NOTE — Telephone Encounter (Signed)
Pt's wife called to speak to a nurse regarding pt's medication. Please call back and advise.

## 2012-03-21 ENCOUNTER — Telehealth: Payer: Self-pay | Admitting: Endocrinology

## 2012-03-21 NOTE — Telephone Encounter (Signed)
Pt's wife called to check on referral to [facility]. They have not heard anything back about an appt. Please advise.

## 2012-03-21 NOTE — Telephone Encounter (Signed)
Message was left on VM for pt to return call to [facility] office regarding referral.

## 2012-09-13 ENCOUNTER — Ambulatory Visit: Payer: Medicare Other | Admitting: Endocrinology

## 2013-01-16 ENCOUNTER — Ambulatory Visit (INDEPENDENT_AMBULATORY_CARE_PROVIDER_SITE_OTHER): Payer: Medicare Other | Admitting: Endocrinology

## 2013-01-16 ENCOUNTER — Encounter: Payer: Self-pay | Admitting: Endocrinology

## 2013-01-16 ENCOUNTER — Telehealth: Payer: Self-pay

## 2013-01-16 VITALS — BP 130/74 | HR 90 | Ht 66.0 in | Wt 198.0 lb

## 2013-01-16 DIAGNOSIS — M109 Gout, unspecified: Secondary | ICD-10-CM | POA: Insufficient documentation

## 2013-01-16 DIAGNOSIS — E119 Type 2 diabetes mellitus without complications: Secondary | ICD-10-CM

## 2013-01-16 LAB — HEMOGLOBIN A1C: Hgb A1c MFr Bld: 6.1 % (ref 4.6–6.5)

## 2013-01-16 LAB — URIC ACID: Uric Acid, Serum: 6.3 mg/dL (ref 4.0–7.8)

## 2013-01-16 MED ORDER — COLCHICINE 0.6 MG PO TABS: ORAL_TABLET | ORAL | Status: DC

## 2013-01-16 NOTE — Progress Notes (Signed)
  Subjective:    Patient ID: Lucas [name], male    DOB: 09/10/1932, 79 y.o.   MRN: [id number]  HPI Pt states 2 weeks of moderate pain rad from the left great toe to the instep.  No assoc fever.   Past Medical History  Diagnosis Date  . DM 07/24/2008  . HYPERCHOLESTEROLEMIA 06/19/2008  . HYPOKALEMIA 07/24/2008  . HYPERTENSION 06/19/2008  . 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 TIA 06/26/2008  . ALLERGIC RHINITIS 01/25/2007    No past surgical history on file.  History   Social History  . Marital Status: Married    Spouse Name: N/A    Number of Children: N/A  . Years of Education: N/A   Occupational History  . Not on file.   Social History Main Topics  . Smoking status: Never Smoker   . Smokeless tobacco: Not on file  . Alcohol Use: Not on file  . Drug Use: Not on file  . Sexual Activity: Not on file   Other Topics Concern  . Not on file   Social History Narrative  . No narrative on file    Current Outpatient Prescriptions on File Prior to Visit  Medication Sig Dispense Refill  . aspirin 81 MG tablet Take 81 mg by mouth daily.        . ciprofloxacin (CIPRO) 500 MG tablet Take 1 tablet (500 mg total) by mouth 2 (two) times daily.  20 tablet  0  . lisinopril-hydrochlorothiazide (PRINZIDE,ZESTORETIC) 20-12.5 MG per tablet 1 1/2 tabs, once a day  150 tablet  3  . sildenafil (VIAGRA) 100 MG tablet Take 100 mg by mouth as needed.        . simvastatin (ZOCOR) 80 MG tablet Take 1 tablet (80 mg total) by mouth at bedtime.  30 tablet  11   No current facility-administered medications on file prior to visit.    Allergies  Allergen Reactions  . Sulfonamide Derivatives     No family history on file.  BP 130/74  Pulse 90  Ht 5\' 6"  (1.676 m)  Wt 198 lb (89.812 kg)  BMI 31.97 kg/m2  SpO2 97%  Review of Systems He has lost a few lbs.  No rash on the foot    Objective:   Physical Exam VITAL SIGNS:   See vs page GENERAL: no distress   (i reviewed results of uric acid and a1c)    Assessment & Plan:  DM: well-controlled Foot pain, new, uncertain etiology Gout.  It is uncertain if this is a recurrence.

## 2013-01-16 NOTE — Telephone Encounter (Signed)
The next option is to take ibuprofen.  You will get better much faster if you elevate your foot above the rest of your body. I hope you feel better soon.  If you don't feel better in 1-2 days, please call back.

## 2013-01-16 NOTE — Patient Instructions (Addendum)
Please come back for a "medicare wellness" appointment in 3 months. i have sent a prescription to your pharmacy, for the gout Skip your next 2 pills of simvastatin, due to an interaction with it and the gout pill. blood tests are being requested for you today.  We'll contact you with results. Please let me know if you decide to go to the prostate doctor.  If this is cancer, this could be your chance to catch it early.

## 2013-01-16 NOTE — Telephone Encounter (Signed)
Pt's wife called back to say that the rx you gave to is too exspensive please change

## 2013-01-17 NOTE — Telephone Encounter (Signed)
Pt advised.

## 2013-04-16 ENCOUNTER — Encounter: Payer: Self-pay | Admitting: Endocrinology

## 2013-04-16 ENCOUNTER — Ambulatory Visit (INDEPENDENT_AMBULATORY_CARE_PROVIDER_SITE_OTHER): Payer: Medicare Other | Admitting: Endocrinology

## 2013-04-16 VITALS — BP 120/70 | HR 83 | Temp 97.7°F | Ht 66.0 in | Wt 200.9 lb

## 2013-04-16 DIAGNOSIS — M109 Gout, unspecified: Secondary | ICD-10-CM

## 2013-04-16 DIAGNOSIS — I10 Essential (primary) hypertension: Secondary | ICD-10-CM

## 2013-04-16 DIAGNOSIS — Z0000 Encounter for general adult medical examination without abnormal findings: Secondary | ICD-10-CM | POA: Diagnosis not present

## 2013-04-16 DIAGNOSIS — E119 Type 2 diabetes mellitus without complications: Secondary | ICD-10-CM

## 2013-04-16 DIAGNOSIS — R972 Elevated prostate specific antigen [PSA]: Secondary | ICD-10-CM | POA: Diagnosis not present

## 2013-04-16 DIAGNOSIS — Z79899 Other long term (current) drug therapy: Secondary | ICD-10-CM

## 2013-04-16 DIAGNOSIS — Z125 Encounter for screening for malignant neoplasm of prostate: Secondary | ICD-10-CM

## 2013-04-16 DIAGNOSIS — R9431 Abnormal electrocardiogram [ECG] [EKG]: Secondary | ICD-10-CM

## 2013-04-16 DIAGNOSIS — E7800 Pure hypercholesterolemia, unspecified: Secondary | ICD-10-CM

## 2013-04-16 DIAGNOSIS — Z23 Encounter for immunization: Secondary | ICD-10-CM

## 2013-04-16 LAB — CBC WITH DIFFERENTIAL/PLATELET
Basophils Absolute: 0 10*3/uL (ref 0.0–0.1)
Eosinophils Absolute: 0.2 10*3/uL (ref 0.0–0.7)
Eosinophils Relative: 2.4 % (ref 0.0–5.0)
Hemoglobin: 15.8 g/dL (ref 13.0–17.0)
MCHC: 33.2 g/dL (ref 30.0–36.0)
MCV: 84.5 fl (ref 78.0–100.0)
Monocytes Absolute: 0.7 10*3/uL (ref 0.1–1.0)
Neutrophils Relative %: 50.3 % (ref 43.0–77.0)
Platelets: 156 10*3/uL (ref 150.0–400.0)
RDW: 17.3 % — ABNORMAL HIGH (ref 11.5–14.6)
WBC: 6.7 10*3/uL (ref 4.5–10.5)

## 2013-04-16 LAB — URINALYSIS, ROUTINE W REFLEX MICROSCOPIC
Bilirubin Urine: NEGATIVE
Nitrite: NEGATIVE
Total Protein, Urine: NEGATIVE
Urine Glucose: NEGATIVE
Urobilinogen, UA: 1 (ref 0.0–1.0)

## 2013-04-16 LAB — URIC ACID: Uric Acid, Serum: 9.1 mg/dL — ABNORMAL HIGH (ref 4.0–7.8)

## 2013-04-16 LAB — HEPATIC FUNCTION PANEL
ALT: 13 U/L (ref 0–53)
AST: 17 U/L (ref 0–37)
Alkaline Phosphatase: 81 U/L (ref 39–117)
Bilirubin, Direct: 0.2 mg/dL (ref 0.0–0.3)
Total Protein: 6.8 g/dL (ref 6.0–8.3)

## 2013-04-16 LAB — LIPID PANEL
Cholesterol: 244 mg/dL — ABNORMAL HIGH (ref 0–200)
HDL: 39.6 mg/dL (ref >39.00)
Total CHOL/HDL Ratio: 6
VLDL: 30.2 mg/dL (ref 0.0–40.0)

## 2013-04-16 LAB — MICROALBUMIN / CREATININE URINE RATIO
Creatinine,U: 169.3 mg/dL
Microalb Creat Ratio: 0.6 mg/g (ref 0.0–30.0)
Microalb, Ur: 1 mg/dL (ref 0.0–1.9)

## 2013-04-16 LAB — BASIC METABOLIC PANEL
BUN: 13 mg/dL (ref 6–23)
Calcium: 9.2 mg/dL (ref 8.4–10.5)
Creatinine, Ser: 1 mg/dL (ref 0.4–1.5)
GFR: 96.37 mL/min (ref >60.00)

## 2013-04-16 LAB — HEMOGLOBIN A1C: Hgb A1c MFr Bld: 6.1 % (ref 4.6–6.5)

## 2013-04-16 LAB — PSA: PSA: 6.27 ng/mL — ABNORMAL HIGH (ref 0.10–4.00)

## 2013-04-16 MED ORDER — SIMVASTATIN 80 MG PO TABS: 80.0000 mg | ORAL_TABLET | Freq: Every day | ORAL | Status: DC

## 2013-04-16 NOTE — Progress Notes (Signed)
Subjective:    Patient ID: Lucas [name], male    DOB: 02/22/1934, 79 y.o.   MRN: [id number]  HPI The state of at least three ongoing medical problems is addressed today, with interval history of each noted here: Dyslipidemia: pt did not get rx filled, due to cost.  Gout: no recent sxs DM: denies weight change Past Medical History  Diagnosis Date  . DM 07/24/2008  . HYPERCHOLESTEROLEMIA 06/19/2008  . HYPOKALEMIA 07/24/2008  . HYPERTENSION 06/19/2008  . 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 TIA 06/26/2008  . ALLERGIC RHINITIS 01/25/2007    History reviewed. No pertinent past surgical history.  History   Social History  . Marital Status: Married    Spouse Name: N/A    Number of Children: N/A  . Years of Education: N/A   Occupational History  . Not on file.   Social History Main Topics  . Smoking status: Never Smoker   . Smokeless tobacco: Not on file  . Alcohol Use: Not on file  . Drug Use: Not on file  . Sexual Activity: Not on file   Other Topics Concern  . Not on file   Social History Narrative  . No narrative on file    Current Outpatient Prescriptions on File Prior to Visit  Medication Sig Dispense Refill  . aspirin 81 MG tablet Take 81 mg by mouth daily.        . colchicine 0.6 MG tablet 1 pill every hour as needed for gout.  maximun 6 per day.  6 tablet  2  . sildenafil (VIAGRA) 100 MG tablet Take 100 mg by mouth as needed.         No current facility-administered medications on file prior to visit.    Allergies  Allergen Reactions  . Sulfonamide Derivatives     History reviewed. No pertinent family history.  BP 120/70  Pulse 83  Temp(Src) 97.7 F (36.5 C) (Oral)  Ht 5\' 6"  (1.676 m)  Wt 200 lb 14.4 oz (91.128 kg)  BMI 32.44 kg/m2  SpO2 97%   Review of Systems Denies chest pain and sob    Objective:   Physical Exam VITAL SIGNS:  See vs page GENERAL: no distress   Lab  Results  Component Value Date   WBC 6.7 04/16/2013   HGB 15.8 04/16/2013   HCT 47.5 04/16/2013   PLT 156.0 04/16/2013   GLUCOSE 91 04/16/2013   CHOL 244* 04/16/2013   TRIG 151.0* 04/16/2013   HDL 39.60 04/16/2013   LDLDIRECT 178.6 04/16/2013   LDLCALC 184* 03/15/2012   ALT 13 04/16/2013   AST 17 04/16/2013   NA 138 04/16/2013   K 2.8* 04/16/2013   CL 95* 04/16/2013   CREATININE 1.0 04/16/2013   BUN 13 04/16/2013   CO2 32 04/16/2013   TSH 0.99 04/16/2013   PSA 6.27* 04/16/2013   HGBA1C 6.1 04/16/2013   MICROALBUR 1.0 04/16/2013      Assessment & Plan:  HTN: he can take less rx, if necessary Gout: he would be better with less HCTZ Hypokalemia: less HCTZ would help this also. Dyslipidemia: therapy limited by noncompliance.  i'll do the best i can.    Subjective:   Patient here for Medicare annual wellness visit and management of other chronic and acute problems.      Risk factors: advanced age    Roster of Physicians Providing Medical Care to Patient:  See "snapshot"   Activities of  Daily Living: In your present state of health, do you have any difficulty performing the following activities?:  Preparing food and eating?: No  Bathing yourself: No  Getting dressed: No  Using the toilet:No  Moving around from place to place: No  In the past year have you fallen or had a near fall?:No    Home Safety: Has smoke detector and wears seat belts. No firearms.   Diet and Exercise  Current exercise habits:  Pt says good Dietary issues discussed: pt reports a healthy diet   Depression Screen  Q1: Over the past two weeks, have you felt down, depressed or hopeless? no  Q2: Over the past two weeks, have you felt little interest or pleasure in doing things? no   The following portions of the patient's history were reviewed and updated as appropriate: allergies, current medications, past family history, past medical history, past social history, past surgical history and  problem list.  Past Medical History  Diagnosis Date  . DM 07/24/2008  . HYPERCHOLESTEROLEMIA 06/19/2008  . HYPOKALEMIA 07/24/2008  . HYPERTENSION 06/19/2008  . 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 TIA 06/26/2008  . ALLERGIC RHINITIS 01/25/2007    History reviewed. No pertinent past surgical history.  History   Social History  . Marital Status: Married    Spouse Name: N/A    Number of Children: N/A  . Years of Education: N/A   Occupational History  . Not on file.   Social History Main Topics  . Smoking status: Never Smoker   . Smokeless tobacco: Not on file  . Alcohol Use: Not on file  . Drug Use: Not on file  . Sexual Activity: Not on file   Other Topics Concern  . Not on file   Social History Narrative  . No narrative on file    Current Outpatient Prescriptions on File Prior to Visit  Medication Sig Dispense Refill  . aspirin 81 MG tablet Take 81 mg by mouth daily.        Marland Kitchen lisinopril-hydrochlorothiazide (PRINZIDE,ZESTORETIC) 20-12.5 MG per tablet 1 1/2 tabs, once a day  150 tablet  3  . colchicine 0.6 MG tablet 1 pill every hour as needed for gout.  maximun 6 per day.  6 tablet  2  . sildenafil (VIAGRA) 100 MG tablet Take 100 mg by mouth as needed.         No current facility-administered medications on file prior to visit.    Allergies  Allergen Reactions  . Sulfonamide Derivatives     History reviewed. No pertinent family history.  BP 120/70  Pulse 83  Temp(Src) 97.7 F (36.5 C) (Oral)  Ht 5\' 6"  (1.676 m)  Wt 200 lb 14.4 oz (91.128 kg)  BMI 32.44 kg/m2  SpO2 97%  Review of Systems  Denies hearing loss, and visual loss Objective:   Vision:  Sees opthalmologist Hearing: grossly normal Body mass index:  See vs page Msk: pt easily and quickly performs "get-up-and-go" from a sitting position Cognitive Impairment Assessment: cognition, memory and judgment appear normal.  remembers 1/3  at 5 minutes (? Effort).  excellent recall.  can easily read and write a sentence.  alert and oriented x 3  Assessment:   Medicare wellness utd on preventive parameters    Plan:   During the course of the visit the patient was educated and counseled about appropriate screening and preventive services including:        Fall  prevention   Diabetes screening  Nutrition counseling  Colonoscopy is refused  Vaccines / LABS Zostavax / Pneumococcal Vaccine refused today  PSA  Patient Instructions (the written plan) was given to the patient.   we discussed code status.  pt requests full code, but would not want to be started or maintained on artificial life-support measures if there was not a reasonable chance of recovery

## 2013-04-16 NOTE — Patient Instructions (Addendum)
i have sent a prescription to your pharmacy, to [facility], to refill the simvastatin.   Let's check an "echocardiogram."  you will receive a phone call, about a day and time for an appointment. blood tests are being requested for you today.  We'll contact you with results. please consider these measures for your health:  minimize alcohol.  do not use tobacco products.  have a colonoscopy at least every 10 years from age 79.  keep firearms safely stored.  always use seat belts.  have working smoke alarms in your home.  see an eye doctor and dentist regularly.  never drive under the influence of alcohol or drugs (including prescription drugs).  it is critically important to prevent falling down (keep floor areas well-lit, dry, and free of loose objects.  If you have a cane, walker, or wheelchair, you should use it, even for short trips around the house.  Also, try not to rush).

## 2013-04-17 ENCOUNTER — Telehealth: Payer: Self-pay | Admitting: *Deleted

## 2013-04-17 MED ORDER — SIMVASTATIN 80 MG PO TABS: 80.0000 mg | ORAL_TABLET | Freq: Every day | ORAL | Status: DC

## 2013-04-17 MED ORDER — LISINOPRIL 20 MG PO TABS: 20.0000 mg | ORAL_TABLET | Freq: Every day | ORAL | Status: DC

## 2013-04-17 MED ORDER — POTASSIUM CHLORIDE ER 20 MEQ PO TBCR: 1.0000 | EXTENDED_RELEASE_TABLET | Freq: Every day | ORAL | Status: DC

## 2013-04-17 NOTE — Telephone Encounter (Signed)
Patient's wife left vm requesting return call concerning some of patient's meds.

## 2013-04-17 NOTE — Telephone Encounter (Signed)
Patient's wife requested that his rx's from 04/16/13 be resent to [facility]. Resent rx's.

## 2013-04-20 ENCOUNTER — Encounter: Payer: Medicare Other | Admitting: Endocrinology

## 2013-05-02 ENCOUNTER — Other Ambulatory Visit (HOSPITAL_COMMUNITY): Payer: Medicare Other

## 2013-06-14 ENCOUNTER — Telehealth: Payer: Self-pay

## 2013-06-14 NOTE — Telephone Encounter (Signed)
Called pt and left message to call back to discuss coming in for a office visit.

## 2013-06-14 NOTE — Telephone Encounter (Signed)
Pt's wife called stating the current blood pressure medication the client is on lisinopril is making the clients feet swell. Wife stated that the old medication lisinopril HCTZ did not cause this and wanted to know if medication could be changed. Please advise, Thanks!

## 2013-06-14 NOTE — Telephone Encounter (Signed)
Please come in for ov tomorrow.  This was we can check the BP, swelling, and possibly blood tests.

## 2013-06-15 NOTE — Telephone Encounter (Signed)
Pt coming if for visit on 06/18/2013.

## 2013-06-18 ENCOUNTER — Ambulatory Visit (INDEPENDENT_AMBULATORY_CARE_PROVIDER_SITE_OTHER): Payer: Medicare Other | Admitting: Endocrinology

## 2013-06-18 ENCOUNTER — Encounter: Payer: Self-pay | Admitting: Endocrinology

## 2013-06-18 VITALS — BP 122/62 | HR 92 | Temp 98.1°F | Ht 66.0 in | Wt 210.0 lb

## 2013-06-18 DIAGNOSIS — E876 Hypokalemia: Secondary | ICD-10-CM | POA: Diagnosis not present

## 2013-06-18 MED ORDER — FUROSEMIDE 20 MG PO TABS: 20.0000 mg | ORAL_TABLET | Freq: Every day | ORAL | Status: DC

## 2013-06-18 MED ORDER — CEFUROXIME AXETIL 250 MG PO TABS: 250.0000 mg | ORAL_TABLET | Freq: Two times a day (BID) | ORAL | Status: AC

## 2013-06-18 NOTE — Patient Instructions (Addendum)
blood tests are being requested for you today.  We'll contact you with results. i have sent a prescription to your pharmacy, to help the swelling.   Please come back for a follow-up appointment in 4 months.   Loratadine-d (non-prescription) will help your congestion.   i have sent a prescription to your pharmacy, for an antibiotic pill.

## 2013-06-18 NOTE — Progress Notes (Signed)
   Subjective:    Patient ID: Lucas CootsBobby R Moroney, male    DOB: 02/29/1934, 80 y.o.   MRN: [id number]  HPI Pt states few days of slight congestion in the nose, but no assoc earache. Pt says edema has recurred off the hctz (it was stopped due to gout and hypokalemia) He denies muscle cramps and sob Past Medical History  Diagnosis Date  . DM 07/24/2008  . HYPERCHOLESTEROLEMIA 06/19/2008  . HYPOKALEMIA 07/24/2008  . HYPERTENSION 06/19/2008  . 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 TIA 06/26/2008  . ALLERGIC RHINITIS 01/25/2007    No past surgical history on file.  History   Social History  . Marital Status: Married    Spouse Name: N/A    Number of Children: N/A  . Years of Education: N/A   Occupational History  . Not on file.   Social History Main Topics  . Smoking status: Never Smoker   . Smokeless tobacco: Not on file  . Alcohol Use: Not on file  . Drug Use: Not on file  . Sexual Activity: Not on file   Other Topics Concern  . Not on file   Social History Narrative  . No narrative on file    Current Outpatient Prescriptions on File Prior to Visit  Medication Sig Dispense Refill  . aspirin 81 MG tablet Take 81 mg by mouth daily.        . colchicine 0.6 MG tablet 1 pill every hour as needed for gout.  maximun 6 per day.  6 tablet  2  . Potassium Chloride ER 20 MEQ TBCR Take 1 tablet by mouth daily.  30 tablet  11  . sildenafil (VIAGRA) 100 MG tablet Take 100 mg by mouth as needed.        . simvastatin (ZOCOR) 80 MG tablet Take 1 tablet (80 mg total) by mouth at bedtime.  90 tablet  3  . lisinopril (PRINIVIL,ZESTRIL) 20 MG tablet Take 1 tablet (20 mg total) by mouth daily.  30 tablet  11   No current facility-administered medications on file prior to visit.    Allergies  Allergen Reactions  . Sulfonamide Derivatives     No family history on file.  BP 122/62  Pulse 92  Temp(Src) 98.1 F (36.7 C)  (Oral)  Ht 5\' 6"  (1.676 m)  Wt 210 lb (95.255 kg)  BMI 33.91 kg/m2  SpO2 91%    Review of Systems Denies chest pain and weight change.    Objective:   Physical Exam VITAL SIGNS:  See vs page GENERAL: no distress LUNGS:  Clear to auscultation head: no deformity eyes: no periorbital swelling, no proptosis external nose and ears are normal mouth: no lesion seen Both eac's and tm's are normal.      Assessment & Plan:  URI, new Edema, due to being off the HCTZ Hypokalemia, on rx HTN: well-controlled

## 2013-06-19 LAB — BASIC METABOLIC PANEL
BUN: 17 mg/dL (ref 6–23)
CO2: 27 meq/L (ref 19–32)
CREATININE: 1.1 mg/dL (ref 0.4–1.5)
Calcium: 9.2 mg/dL (ref 8.4–10.5)
Chloride: 105 mEq/L (ref 96–112)
GFR: 81.59 mL/min (ref >60.00)
GLUCOSE: 95 mg/dL (ref 70–99)
POTASSIUM: 3.2 meq/L — AB (ref 3.5–5.1)
Sodium: 141 mEq/L (ref 135–145)

## 2013-06-26 ENCOUNTER — Encounter: Payer: Self-pay | Admitting: Endocrinology

## 2013-06-26 ENCOUNTER — Ambulatory Visit (INDEPENDENT_AMBULATORY_CARE_PROVIDER_SITE_OTHER): Payer: Medicare Other | Admitting: Endocrinology

## 2013-06-26 VITALS — BP 142/90 | HR 102 | Temp 98.2°F | Ht 66.0 in | Wt 215.0 lb

## 2013-06-26 DIAGNOSIS — I10 Essential (primary) hypertension: Secondary | ICD-10-CM | POA: Diagnosis not present

## 2013-06-26 MED ORDER — FUROSEMIDE 40 MG PO TABS: 40.0000 mg | ORAL_TABLET | Freq: Every day | ORAL | Status: DC

## 2013-06-26 MED ORDER — LOSARTAN POTASSIUM 100 MG PO TABS: 100.0000 mg | ORAL_TABLET | Freq: Every day | ORAL | Status: DC

## 2013-06-26 NOTE — Progress Notes (Signed)
   Subjective:    Patient ID: Lucas CootsBobby R Bartko, male    DOB: 08/31/1933, 80 y.o.   MRN: [id number]  HPI The state of at least three ongoing medical problems is addressed today, with interval history of each noted here: Edema: has recurred HTN: denies sob.  He stopped zestril, thinking it caused edema.   Hypokalemia: denies cramps.   Past Medical History  Diagnosis Date  . DM 07/24/2008  . HYPERCHOLESTEROLEMIA 06/19/2008  . HYPOKALEMIA 07/24/2008  . HYPERTENSION 06/19/2008  . 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 TIA 06/26/2008  . ALLERGIC RHINITIS 01/25/2007    No past surgical history on file.  History   Social History  . Marital Status: Married    Spouse Name: N/A    Number of Children: N/A  . Years of Education: N/A   Occupational History  . Not on file.   Social History Main Topics  . Smoking status: Never Smoker   . Smokeless tobacco: Not on file  . Alcohol Use: Not on file  . Drug Use: Not on file  . Sexual Activity: Not on file   Other Topics Concern  . Not on file   Social History Narrative  . No narrative on file    Current Outpatient Prescriptions on File Prior to Visit  Medication Sig Dispense Refill  . aspirin 81 MG tablet Take 81 mg by mouth daily.        . cefUROXime (CEFTIN) 250 MG tablet Take 1 tablet (250 mg total) by mouth 2 (two) times daily.  14 tablet  0  . sildenafil (VIAGRA) 100 MG tablet Take 100 mg by mouth as needed.        . simvastatin (ZOCOR) 80 MG tablet Take 1 tablet (80 mg total) by mouth at bedtime.  90 tablet  3   No current facility-administered medications on file prior to visit.    Allergies  Allergen Reactions  . Sulfonamide Derivatives     No family history on file.  BP 142/90  Pulse 102  Temp(Src) 98.2 F (36.8 C) (Oral)  Ht 5\' 6"  (1.676 m)  Wt 215 lb (97.523 kg)  BMI 34.72 kg/m2  SpO2 92%  Review of Systems No recent gout sxs.  He now reports  a chronic cough.      Objective:   Physical Exam VITAL SIGNS:  See vs page. GENERAL: no distress. Ext: 2+ bilat leg edema.     Assessment & Plan:  HTN: therapy limited by noncompliance.  i'll do the best i can. Cough: could be due to recent URI, but may be exac by acei. Edema, persistent, but not due to acei. Hypokalemia: with these med adjustments, he won't need recheck until he returns in 2 weeks.

## 2013-06-26 NOTE — Patient Instructions (Addendum)
Please double the furosemide.  i have sent a prescription to your pharmacy. Please change the lisinopril to losartan.  i have sent a prescription to your pharmacy for this also.   You can stop the potassium and gout pills.   Please come back for a follow-up appointment in 2 weeks.

## 2013-07-06 ENCOUNTER — Encounter: Payer: Self-pay | Admitting: Endocrinology

## 2013-07-06 ENCOUNTER — Ambulatory Visit (INDEPENDENT_AMBULATORY_CARE_PROVIDER_SITE_OTHER): Payer: Medicare Other | Admitting: Endocrinology

## 2013-07-06 VITALS — BP 128/90 | HR 84 | Temp 97.9°F | Ht 66.0 in | Wt 216.0 lb

## 2013-07-06 DIAGNOSIS — E876 Hypokalemia: Secondary | ICD-10-CM

## 2013-07-06 DIAGNOSIS — M109 Gout, unspecified: Secondary | ICD-10-CM

## 2013-07-06 DIAGNOSIS — R609 Edema, unspecified: Secondary | ICD-10-CM

## 2013-07-06 LAB — BASIC METABOLIC PANEL
BUN: 15 mg/dL (ref 6–23)
CALCIUM: 9 mg/dL (ref 8.4–10.5)
CO2: 32 meq/L (ref 19–32)
CREATININE: 1.3 mg/dL (ref 0.4–1.5)
Chloride: 103 mEq/L (ref 96–112)
GFR: 67.49 mL/min (ref >60.00)
Glucose, Bld: 109 mg/dL — ABNORMAL HIGH (ref 70–99)
Potassium: 3.6 mEq/L (ref 3.5–5.1)
Sodium: 142 mEq/L (ref 135–145)

## 2013-07-06 LAB — URIC ACID: Uric Acid, Serum: 6.9 mg/dL (ref 4.0–7.8)

## 2013-07-06 NOTE — Progress Notes (Signed)
   Subjective:    Patient ID: Lucas [name], male    DOB: 06/08/1933, 80 y.o.   MRN: [id number]  HPI The state of at least three ongoing medical problems is addressed today, with interval history of each noted here: Edema: has improved but not resolved HTN: denies sob.  He stopped zestril, thinking it caused edema.   Hypokalemia: denies cramps.   Past Medical History  Diagnosis Date  . DM 07/24/2008  . HYPERCHOLESTEROLEMIA 06/19/2008  . HYPOKALEMIA 07/24/2008  . HYPERTENSION 06/19/2008  . 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 TIA 06/26/2008  . ALLERGIC RHINITIS 01/25/2007    No past surgical history on file.  History   Social History  . Marital Status: Married    Spouse Name: N/A    Number of Children: N/A  . Years of Education: N/A   Occupational History  . Not on file.   Social History Main Topics  . Smoking status: Never Smoker   . Smokeless tobacco: Not on file  . Alcohol Use: Not on file  . Drug Use: Not on file  . Sexual Activity: Not on file   Other Topics Concern  . Not on file   Social History Narrative  . No narrative on file    Current Outpatient Prescriptions on File Prior to Visit  Medication Sig Dispense Refill  . aspirin 81 MG tablet Take 81 mg by mouth daily.        . furosemide (LASIX) 40 MG tablet Take 1 tablet (40 mg total) by mouth daily.  30 tablet  3  . losartan (COZAAR) 100 MG tablet Take 1 tablet (100 mg total) by mouth daily.  30 tablet  11  . sildenafil (VIAGRA) 100 MG tablet Take 100 mg by mouth as needed.        . simvastatin (ZOCOR) 80 MG tablet Take 1 tablet (80 mg total) by mouth at bedtime.  90 tablet  3   No current facility-administered medications on file prior to visit.    Allergies  Allergen Reactions  . Sulfonamide Derivatives     No family history on file.  BP 128/90  Pulse 84  Temp(Src) 97.9 F (36.6 C) (Oral)  Ht 5\' 6"  (1.676 m)  Wt 216 lb  (97.977 kg)  BMI 34.88 kg/m2  SpO2 90%  Review of Systems Denies cough and sob.     Objective:   Physical Exam VITAL SIGNS:  See vs page GENERAL: no distress LUNGS:  Clear to auscultation Ext: 2+ bilat leg edema  Lab Results  Component Value Date   CREATININE 1.3 07/06/2013   BUN 15 07/06/2013   NA 142 07/06/2013   K 3.6 07/06/2013   CL 103 07/06/2013   CO2 32 07/06/2013      Assessment & Plan:  Edema: uncertain etiology.  little if any improvement. Hypokalemia, better HTN: borderline control, but there may be a situational component.

## 2013-07-06 NOTE — Patient Instructions (Addendum)
blood tests are being requested for you today.  We'll contact you with results.    Please come back for a follow-up appointment in 1 month.  Call if you want compression stockings. Also, please call if the swelling extends to the thighs.

## 2013-07-11 ENCOUNTER — Ambulatory Visit: Payer: Medicare Other | Admitting: Endocrinology

## 2013-07-13 ENCOUNTER — Telehealth: Payer: Self-pay

## 2013-07-13 NOTE — Telephone Encounter (Signed)
Pt's wife called stating the blood pressure medication that he is currently on is not agreeing with him. She states that the pt is feeling tired and has no energy. Pt is requesting to be put back on the lisinopril HCTZ because pt states he did not have this issue will taking the medication.  Please advise, Thanks!

## 2013-07-15 MED ORDER — LISINOPRIL-HYDROCHLOROTHIAZIDE 20-12.5 MG PO TABS: ORAL_TABLET | ORAL | Status: DC

## 2013-07-15 NOTE — Telephone Encounter (Signed)
Change losartan and furosemide to lisinopril/hct.  i have sent a prescription to your pharmacy.

## 2013-07-16 NOTE — Telephone Encounter (Signed)
Pt informed

## 2013-08-03 ENCOUNTER — Ambulatory Visit: Payer: Medicare Other | Admitting: Endocrinology

## 2013-08-06 ENCOUNTER — Encounter: Payer: Self-pay | Admitting: Endocrinology

## 2013-08-06 ENCOUNTER — Ambulatory Visit (INDEPENDENT_AMBULATORY_CARE_PROVIDER_SITE_OTHER): Payer: Medicare Other | Admitting: Endocrinology

## 2013-08-06 VITALS — BP 132/86 | HR 108 | Temp 97.9°F | Ht 66.0 in | Wt 207.0 lb

## 2013-08-06 DIAGNOSIS — I10 Essential (primary) hypertension: Secondary | ICD-10-CM

## 2013-08-06 NOTE — Patient Instructions (Addendum)
Please reduce the lisinopril-HCT to 1 pill per day, and continue the furosemide. Please come back for a follow-up appointment in 6 weeks.

## 2013-08-06 NOTE — Progress Notes (Signed)
   Subjective:    Patient ID: Lucas [name], male    DOB: 02/02/1934, 80 y.o.   MRN: [id number]  HPI The state of at least three ongoing medical problems is addressed today, with interval history of each noted here: Edema: has improved.  He also takes the lasix.   HTN: pt found his bp was 108/86, 2 days ago, so he stopped lisinopril-hct. Hypokalemia: he denies cramps.  Gout: he denies great toe pain.   Past Medical History  Diagnosis Date  . DM 07/24/2008  . HYPERCHOLESTEROLEMIA 06/19/2008  . HYPOKALEMIA 07/24/2008  . HYPERTENSION 06/19/2008  . 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 TIA 06/26/2008  . ALLERGIC RHINITIS 01/25/2007    No past surgical history on file.  History   Social History  . Marital Status: Married    Spouse Name: N/A    Number of Children: N/A  . Years of Education: N/A   Occupational History  . Not on file.   Social History Main Topics  . Smoking status: Never Smoker   . Smokeless tobacco: Not on file  . Alcohol Use: Not on file  . Drug Use: Not on file  . Sexual Activity: Not on file   Other Topics Concern  . Not on file   Social History Narrative  . No narrative on file    Current Outpatient Prescriptions on File Prior to Visit  Medication Sig Dispense Refill  . aspirin 81 MG tablet Take 81 mg by mouth daily.        . sildenafil (VIAGRA) 100 MG tablet Take 100 mg by mouth as needed.        . simvastatin (ZOCOR) 80 MG tablet Take 1 tablet (80 mg total) by mouth at bedtime.  90 tablet  3   No current facility-administered medications on file prior to visit.    Allergies  Allergen Reactions  . Sulfonamide Derivatives     No family history on file.  BP 132/86  Pulse 108  Temp(Src) 97.9 F (36.6 C) (Oral)  Ht 5\' 6"  (1.676 m)  Wt 207 lb (93.895 kg)  BMI 33.43 kg/m2  SpO2 94%  Review of Systems Denies sob and cough.      Objective:   Physical Exam VITAL SIGNS:   See vs page GENERAL: no distress Ext: 2+ bilat leg edema      Assessment & Plan:  Edema: persistent. uncertain etiology HTN: apparently overcontrolled, when he was taking medication. Gout: we'll have to reassess this back on his zestoretic. Cough: resolved despite being back on zestoretic, so this appears to not be the cause.

## 2013-09-17 ENCOUNTER — Encounter: Payer: Self-pay | Admitting: Endocrinology

## 2013-09-17 ENCOUNTER — Ambulatory Visit (INDEPENDENT_AMBULATORY_CARE_PROVIDER_SITE_OTHER): Payer: Medicare Other | Admitting: Endocrinology

## 2013-09-17 ENCOUNTER — Ambulatory Visit
Admission: RE | Admit: 2013-09-17 | Discharge: 2013-09-17 | Disposition: A | Discharge status: Home / Self Care | Payer: Medicare Other | Source: Ambulatory Visit | Attending: Endocrinology | Admitting: Endocrinology

## 2013-09-17 VITALS — BP 118/70 | HR 74 | Temp 98.1°F | Ht 66.0 in | Wt 203.0 lb

## 2013-09-17 DIAGNOSIS — R059 Cough, unspecified: Secondary | ICD-10-CM

## 2013-09-17 DIAGNOSIS — R05 Cough: Secondary | ICD-10-CM

## 2013-09-17 DIAGNOSIS — R0989 Other specified symptoms and signs involving the circulatory and respiratory systems: Secondary | ICD-10-CM | POA: Diagnosis not present

## 2013-09-17 MED ORDER — CEFUROXIME AXETIL 250 MG PO TABS: 250.0000 mg | ORAL_TABLET | Freq: Two times a day (BID) | ORAL | Status: AC

## 2013-09-17 MED ORDER — BENZONATATE 100 MG PO CAPS: 100.0000 mg | ORAL_CAPSULE | Freq: Three times a day (TID) | ORAL | Status: DC | PRN

## 2013-09-17 NOTE — Progress Notes (Signed)
   Subjective:    Patient ID: Lucas [name], male    DOB: 10/09/1933, 80 y.o.   MRN: [id number]  HPI Pt states 2 weeks of slightly productive cough in the chest, but no assoc sob.  Pt says the acei is not the cause of the cough. Past Medical History  Diagnosis Date  . DM 07/24/2008  . HYPERCHOLESTEROLEMIA 06/19/2008  . HYPOKALEMIA 07/24/2008  . HYPERTENSION 06/19/2008  . 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 TIA 06/26/2008  . ALLERGIC RHINITIS 01/25/2007    No past surgical history on file.  History   Social History  . Marital Status: Married    Spouse Name: N/A    Number of Children: N/A  . Years of Education: N/A   Occupational History  . Not on file.   Social History Main Topics  . Smoking status: Never Smoker   . Smokeless tobacco: Not on file  . Alcohol Use: Not on file  . Drug Use: Not on file  . Sexual Activity: Not on file   Other Topics Concern  . Not on file   Social History Narrative  . No narrative on file    Current Outpatient Prescriptions on File Prior to Visit  Medication Sig Dispense Refill  . aspirin 81 MG tablet Take 81 mg by mouth daily.        Marland Kitchen. lisinopril-hydrochlorothiazide (PRINZIDE,ZESTORETIC) 20-12.5 MG per tablet Take 1 tablet by mouth daily.      . furosemide (LASIX) 40 MG tablet Take 40 mg by mouth daily.      . sildenafil (VIAGRA) 100 MG tablet Take 100 mg by mouth as needed.        . simvastatin (ZOCOR) 80 MG tablet Take 1 tablet (80 mg total) by mouth at bedtime.  90 tablet  3   No current facility-administered medications on file prior to visit.    Allergies  Allergen Reactions  . Sulfonamide Derivatives     No family history on file.  BP 118/70  Pulse 74  Temp(Src) 98.1 F (36.7 C) (Oral)  Ht 5\' 6"  (1.676 m)  Wt 203 lb (92.08 kg)  BMI 32.78 kg/m2  SpO2 94%  Review of Systems He has rhinorrhea, but no nasal congestion.    Objective:   Physical  Exam VITAL SIGNS:  See vs page GENERAL: no distress   CXR: several abnormalities    Assessment & Plan:  Cough, recurrent: acei could contribute, but pt declines to change. Abnormal cxr: possibly due to acute bronchitis. HTN: well-controlled

## 2013-09-17 NOTE — Patient Instructions (Addendum)
Let's check a chest-x-ray, downstairs. i have sent 2 prescriptions to your pharmacy: antibiotic and cough pills.   Please come back for a follow-up appointment in 3 months.

## 2013-09-18 ENCOUNTER — Ambulatory Visit: Payer: Medicare Other | Admitting: Endocrinology

## 2013-10-03 ENCOUNTER — Telehealth: Payer: Self-pay | Admitting: Endocrinology

## 2013-10-03 ENCOUNTER — Ambulatory Visit: Payer: Medicare Other | Admitting: Endocrinology

## 2013-10-03 NOTE — Telephone Encounter (Signed)
pts BP is now 132/88- it is coming down pt states he has not taken his meds in a few days. Pt's wife cancelling appt

## 2013-10-03 NOTE — Telephone Encounter (Signed)
Yes, ov this afternoon please

## 2013-10-03 NOTE — Telephone Encounter (Signed)
See below, does pt need appointment? Thanks!

## 2013-10-03 NOTE — Telephone Encounter (Signed)
Pt coming in at 3:45pm.

## 2013-10-03 NOTE — Telephone Encounter (Signed)
Wife took pt's BP this AM and it was 148/100 and yesterday it was 138/60. Please advise.

## 2013-10-03 NOTE — Telephone Encounter (Signed)
See below, Thanks!  

## 2013-12-17 ENCOUNTER — Ambulatory Visit (INDEPENDENT_AMBULATORY_CARE_PROVIDER_SITE_OTHER): Payer: Medicare Other | Admitting: Endocrinology

## 2013-12-17 ENCOUNTER — Encounter: Payer: Self-pay | Admitting: Endocrinology

## 2013-12-17 VITALS — BP 118/76 | HR 85 | Temp 98.3°F | Ht 66.0 in | Wt 201.0 lb

## 2013-12-17 DIAGNOSIS — I10 Essential (primary) hypertension: Secondary | ICD-10-CM | POA: Diagnosis not present

## 2013-12-17 MED ORDER — SILDENAFIL CITRATE 100 MG PO TABS: 100.0000 mg | ORAL_TABLET | ORAL | Status: DC | PRN

## 2013-12-17 NOTE — Patient Instructions (Addendum)
Please come back for a "medicare wellness" appointment in 4 months (after 04/16/14).   Please reduce lisinopril-HCT to 1 tab per day.

## 2013-12-17 NOTE — Progress Notes (Signed)
   Subjective:    Patient ID: Lucas [name], male    DOB: 10/22/1933, 80 y.o.   MRN: [id number]  HPI The state of at least three ongoing medical problems is addressed today, with interval history of each noted here: Gout: no recent sxs.   Edema: pt says not much recently.   HTN: pt says he takes zestoretic 1 1/2 tabs qd.  Denies sob.   Past Medical History  Diagnosis Date  . DM 07/24/2008  . HYPERCHOLESTEROLEMIA 06/19/2008  . HYPOKALEMIA 07/24/2008  . HYPERTENSION 06/19/2008  . 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 TIA 06/26/2008  . ALLERGIC RHINITIS 01/25/2007    No past surgical history on file.  History   Social History  . Marital Status: Married    Spouse Name: N/A    Number of Children: N/A  . Years of Education: N/A   Occupational History  . Not on file.   Social History Main Topics  . Smoking status: Never Smoker   . Smokeless tobacco: Not on file  . Alcohol Use: Not on file  . Drug Use: Not on file  . Sexual Activity: Not on file   Other Topics Concern  . Not on file   Social History Narrative  . No narrative on file    Current Outpatient Prescriptions on File Prior to Visit  Medication Sig Dispense Refill  . aspirin 81 MG tablet Take 81 mg by mouth daily.        . furosemide (LASIX) 40 MG tablet Take 40 mg by mouth daily.      Marland Kitchen. lisinopril-hydrochlorothiazide (PRINZIDE,ZESTORETIC) 20-12.5 MG per tablet Take 1 tablet by mouth daily.      . simvastatin (ZOCOR) 80 MG tablet Take 1 tablet (80 mg total) by mouth at bedtime.  90 tablet  3   No current facility-administered medications on file prior to visit.    Allergies  Allergen Reactions  . Sulfonamide Derivatives     No family history on file.  BP 118/76  Pulse 85  Temp(Src) 98.3 F (36.8 C) (Oral)  Ht 5\' 6"  (1.676 m)  Wt 201 lb (91.173 kg)  BMI 32.46 kg/m2  SpO2 93%   Review of Systems Denies chest pain.  He has lost a  few lbs.     Objective:   Physical Exam VITAL SIGNS:  See vs page.   GENERAL: no distress.   Ext: 2+ bilat leg edema.         Assessment & Plan:  HTN: overcontrolled Noncompliance with medication dosing: I'll work around this as best I can. Gout: not evident clinically, but he needs to minimize uric acid level.    Patient is advised the following: Patient Instructions  Please come back for a "medicare wellness" appointment in 4 months (after 04/16/14).   Please reduce lisinopril-HCT to 1 tab per day.

## 2014-02-26 ENCOUNTER — Other Ambulatory Visit: Payer: Self-pay

## 2014-02-26 MED ORDER — LISINOPRIL-HYDROCHLOROTHIAZIDE 20-12.5 MG PO TABS: 1.0000 | ORAL_TABLET | Freq: Every day | ORAL | Status: DC

## 2014-04-24 ENCOUNTER — Encounter: Payer: Self-pay | Admitting: Endocrinology

## 2014-04-24 ENCOUNTER — Ambulatory Visit (INDEPENDENT_AMBULATORY_CARE_PROVIDER_SITE_OTHER): Payer: Medicare Other | Admitting: Endocrinology

## 2014-04-24 VITALS — BP 132/86 | HR 78 | Temp 98.7°F | Ht 66.0 in | Wt 202.0 lb

## 2014-04-24 DIAGNOSIS — M109 Gout, unspecified: Secondary | ICD-10-CM

## 2014-04-24 DIAGNOSIS — E7800 Pure hypercholesterolemia, unspecified: Secondary | ICD-10-CM

## 2014-04-24 DIAGNOSIS — Z125 Encounter for screening for malignant neoplasm of prostate: Secondary | ICD-10-CM

## 2014-04-24 DIAGNOSIS — E876 Hypokalemia: Secondary | ICD-10-CM

## 2014-04-24 DIAGNOSIS — I10 Essential (primary) hypertension: Secondary | ICD-10-CM

## 2014-04-24 DIAGNOSIS — E119 Type 2 diabetes mellitus without complications: Secondary | ICD-10-CM

## 2014-04-24 MED ORDER — FUROSEMIDE 40 MG PO TABS: 40.0000 mg | ORAL_TABLET | Freq: Every day | ORAL | Status: DC

## 2014-04-24 NOTE — Patient Instructions (Addendum)
Please come back for a "medicare wellness" appointment in 1 month.   blood tests are being requested for you today.  We'll let you know about the results.   Please resume furosemide.  i have sent a prescription to your pharmacy.

## 2014-04-24 NOTE — Progress Notes (Signed)
   Subjective:    Patient ID: Lucas [name], male    DOB: 12/10/1933, 80 y.o.   MRN: [id number]  HPI The state of at least three ongoing medical problems is addressed today, with interval history of each noted here:  Gout: no recent sxs.  He takes no medication for this.   Edema: pt says it persists.  He does not take lasix. HTN: pt says he still takes zestoretic 1 1/2 tabs qd.  Denies sob.   DM: denies weight change.   Past Medical History  Diagnosis Date  . DM 07/24/2008  . HYPERCHOLESTEROLEMIA 06/19/2008  . HYPOKALEMIA 07/24/2008  . HYPERTENSION 06/19/2008  . 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 TIA 06/26/2008  . ALLERGIC RHINITIS 01/25/2007    No past surgical history on file.  History   Social History  . Marital Status: Married    Spouse Name: N/A    Number of Children: N/A  . Years of Education: N/A   Occupational History  . Not on file.   Social History Main Topics  . Smoking status: Never Smoker   . Smokeless tobacco: Not on file  . Alcohol Use: Not on file  . Drug Use: Not on file  . Sexual Activity: Not on file   Other Topics Concern  . Not on file   Social History Narrative    Current Outpatient Prescriptions on File Prior to Visit  Medication Sig Dispense Refill  . aspirin 81 MG tablet Take 81 mg by mouth daily.      Marland Kitchen. lisinopril-hydrochlorothiazide (PRINZIDE,ZESTORETIC) 20-12.5 MG per tablet Take 1 tablet by mouth daily. 90 tablet 2  . sildenafil (VIAGRA) 100 MG tablet Take 1 tablet (100 mg total) by mouth as needed. 10 tablet 11  . simvastatin (ZOCOR) 80 MG tablet Take 1 tablet (80 mg total) by mouth at bedtime. 90 tablet 3   No current facility-administered medications on file prior to visit.    Allergies  Allergen Reactions  . Sulfonamide Derivatives     No family history on file.  BP 132/86 mmHg  Pulse 78  Temp(Src) 98.7 F (37.1 C) (Oral)  Ht 5\' 6"  (1.676 m)  Wt 202  lb (91.627 kg)  BMI 32.62 kg/m2  SpO2 93%    Review of Systems Denies chest pain and sob.       Objective:   Physical Exam VITAL SIGNS:  See vs page GENERAL: no distress Pulses: dorsalis pedis intact bilat.   Feet: no deformity.  1+ bilat leg edema.   Skin:  no ulcer on the feet.  normal color and temp. Neuro: sensation is intact to touch on the feet.     i reviewed electrocardiogram: it appears to show atrial rhythm of uncertain type.      Assessment & Plan:  Abnormal ecg: unchanged.  No w/u needed now Edema: worse of lasix HTN: well-controlled, but he cannot decrease zestoretic now.   Noncompliance with meds: I'll work around this as best I can.   Patient is advised the following: Patient Instructions  Please come back for a "medicare wellness" appointment in 1 month.   blood tests are being requested for you today.  We'll let you know about the results.   Please resume furosemide.  i have sent a prescription to your pharmacy.

## 2014-04-29 ENCOUNTER — Other Ambulatory Visit: Payer: Self-pay

## 2014-04-29 ENCOUNTER — Other Ambulatory Visit: Payer: Medicare Other

## 2014-04-29 ENCOUNTER — Other Ambulatory Visit (INDEPENDENT_AMBULATORY_CARE_PROVIDER_SITE_OTHER): Payer: Medicare Other

## 2014-04-29 DIAGNOSIS — E119 Type 2 diabetes mellitus without complications: Secondary | ICD-10-CM

## 2014-04-29 DIAGNOSIS — E7800 Pure hypercholesterolemia, unspecified: Secondary | ICD-10-CM

## 2014-04-29 DIAGNOSIS — E876 Hypokalemia: Secondary | ICD-10-CM

## 2014-04-29 DIAGNOSIS — M109 Gout, unspecified: Secondary | ICD-10-CM | POA: Diagnosis not present

## 2014-04-29 DIAGNOSIS — I10 Essential (primary) hypertension: Secondary | ICD-10-CM | POA: Diagnosis not present

## 2014-04-29 LAB — CBC WITH DIFFERENTIAL/PLATELET
BASOS ABS: 0 10*3/uL (ref 0.0–0.1)
BASOS PCT: 0.7 % (ref 0.0–3.0)
Eosinophils Absolute: 0.1 10*3/uL (ref 0.0–0.7)
Eosinophils Relative: 1.1 % (ref 0.0–5.0)
HCT: 49.3 % (ref 39.0–52.0)
Hemoglobin: 15.6 g/dL (ref 13.0–17.0)
LYMPHS PCT: 31.6 % (ref 12.0–46.0)
Lymphs Abs: 2.1 10*3/uL (ref 0.7–4.0)
MCHC: 31.6 g/dL (ref 30.0–36.0)
MCV: 87.5 fl (ref 78.0–100.0)
MONO ABS: 0.7 10*3/uL (ref 0.1–1.0)
Monocytes Relative: 10.1 % (ref 3.0–12.0)
NEUTROS PCT: 56.5 % (ref 43.0–77.0)
Neutro Abs: 3.7 10*3/uL (ref 1.4–7.7)
Platelets: 173 10*3/uL (ref 150.0–400.0)
RBC: 5.63 Mil/uL (ref 4.22–5.81)
RDW: 16.5 % — AB (ref 11.5–15.5)
WBC: 6.6 10*3/uL (ref 4.0–10.5)

## 2014-04-29 LAB — URINALYSIS, ROUTINE W REFLEX MICROSCOPIC
Bilirubin Urine: NEGATIVE
Ketones, ur: NEGATIVE
NITRITE: NEGATIVE
SPECIFIC GRAVITY, URINE: 1.01 (ref 1.000–1.030)
UROBILINOGEN UA: 4 — AB (ref 0.0–1.0)
Urine Glucose: NEGATIVE
pH: 7 (ref 5.0–8.0)

## 2014-04-29 LAB — LIPID PANEL
CHOL/HDL RATIO: 6
CHOLESTEROL: 210 mg/dL — AB (ref 0–200)
HDL: 35.1 mg/dL — ABNORMAL LOW (ref >39.00)
LDL CALC: 150 mg/dL — AB (ref 0–99)
NonHDL: 174.9
TRIGLYCERIDES: 126 mg/dL (ref 0.0–149.0)
VLDL: 25.2 mg/dL (ref 0.0–40.0)

## 2014-04-29 LAB — TSH: TSH: 1.96 u[IU]/mL (ref 0.35–4.50)

## 2014-04-29 LAB — MICROALBUMIN / CREATININE URINE RATIO
Creatinine,U: 123.8 mg/dL
Microalb Creat Ratio: 8.8 mg/g (ref 0.0–30.0)
Microalb, Ur: 10.9 mg/dL — ABNORMAL HIGH (ref 0.0–1.9)

## 2014-04-29 LAB — HEMOGLOBIN A1C: Hgb A1c MFr Bld: 6.1 % (ref 4.6–6.5)

## 2014-04-30 ENCOUNTER — Other Ambulatory Visit: Payer: Self-pay | Admitting: Endocrinology

## 2014-04-30 ENCOUNTER — Other Ambulatory Visit: Payer: Medicare Other

## 2014-04-30 ENCOUNTER — Telehealth: Payer: Self-pay | Admitting: Endocrinology

## 2014-04-30 DIAGNOSIS — R319 Hematuria, unspecified: Secondary | ICD-10-CM | POA: Diagnosis not present

## 2014-04-30 DIAGNOSIS — N390 Urinary tract infection, site not specified: Secondary | ICD-10-CM

## 2014-04-30 LAB — BASIC METABOLIC PANEL
BUN: 16 mg/dL (ref 6–23)
CALCIUM: 9.6 mg/dL (ref 8.4–10.5)
CO2: 30 mEq/L (ref 19–32)
Chloride: 97 mEq/L (ref 96–112)
Creatinine, Ser: 1 mg/dL (ref 0.4–1.5)
GFR: 89.68 mL/min (ref >60.00)
Glucose, Bld: 90 mg/dL (ref 70–99)
Potassium: 3.6 mEq/L (ref 3.5–5.1)
Sodium: 138 mEq/L (ref 135–145)

## 2014-04-30 LAB — HEPATIC FUNCTION PANEL (6)
ALBUMIN: 4 g/dL (ref 3.5–4.8)
ALK PHOS: 107 IU/L (ref 39–117)
ALT: 10 IU/L (ref 0–44)
AST: 17 IU/L (ref 0–40)
Bilirubin, Direct: 0.41 mg/dL — ABNORMAL HIGH (ref 0.00–0.40)
Total Bilirubin: 1.2 mg/dL (ref 0.0–1.2)

## 2014-04-30 MED ORDER — CIPROFLOXACIN HCL 500 MG PO TABS: 500.0000 mg | ORAL_TABLET | Freq: Two times a day (BID) | ORAL | Status: DC

## 2014-04-30 MED ORDER — SIMVASTATIN 80 MG PO TABS: 80.0000 mg | ORAL_TABLET | Freq: Every day | ORAL | Status: DC

## 2014-04-30 NOTE — Telephone Encounter (Signed)
Pt needs further explanation of last message

## 2014-04-30 NOTE — Telephone Encounter (Signed)
Pt's wife advised that antibiotic was given because a UTI was seen in his recent lab work. Pt's wife voiced understanding.

## 2014-05-01 LAB — CULTURE, URINE COMPREHENSIVE
Colony Count: NO GROWTH
Organism ID, Bacteria: NO GROWTH

## 2014-06-10 ENCOUNTER — Ambulatory Visit: Payer: Medicare Other | Admitting: Endocrinology

## 2014-06-19 DIAGNOSIS — H25813 Combined forms of age-related cataract, bilateral: Secondary | ICD-10-CM | POA: Diagnosis not present

## 2014-06-19 DIAGNOSIS — H5201 Hypermetropia, right eye: Secondary | ICD-10-CM | POA: Diagnosis not present

## 2014-06-19 DIAGNOSIS — H52221 Regular astigmatism, right eye: Secondary | ICD-10-CM | POA: Diagnosis not present

## 2014-06-26 ENCOUNTER — Ambulatory Visit (INDEPENDENT_AMBULATORY_CARE_PROVIDER_SITE_OTHER): Payer: Medicare Other | Admitting: Endocrinology

## 2014-06-26 ENCOUNTER — Encounter: Payer: Self-pay | Admitting: Endocrinology

## 2014-06-26 VITALS — BP 132/84 | HR 86 | Temp 97.5°F | Ht 66.0 in | Wt 199.0 lb

## 2014-06-26 DIAGNOSIS — R319 Hematuria, unspecified: Principal | ICD-10-CM

## 2014-06-26 DIAGNOSIS — Z0000 Encounter for general adult medical examination without abnormal findings: Secondary | ICD-10-CM

## 2014-06-26 DIAGNOSIS — N390 Urinary tract infection, site not specified: Secondary | ICD-10-CM

## 2014-06-26 LAB — URINALYSIS, ROUTINE W REFLEX MICROSCOPIC
Bilirubin Urine: NEGATIVE
Ketones, ur: NEGATIVE
Nitrite: NEGATIVE
PH: 6.5 (ref 5.0–8.0)
SPECIFIC GRAVITY, URINE: 1.02 (ref 1.000–1.030)
Total Protein, Urine: NEGATIVE
URINE GLUCOSE: NEGATIVE
UROBILINOGEN UA: 0.2 (ref 0.0–1.0)

## 2014-06-26 NOTE — Patient Instructions (Addendum)
A urine test is requested for you today.  We'll let you know about the results. please consider these measures for your health:  minimize alcohol.  do not use tobacco products.  have a colonoscopy at least every 10 years from age 81.  keep firearms safely stored.  always use seat belts.  have working smoke alarms in your home.  see an eye doctor and dentist regularly.  never drive under the influence of alcohol or drugs (including prescription drugs).   it is critically important to prevent falling down (keep floor areas well-lit, dry, and free of loose objects.  If you have a cane, walker, or wheelchair, you should use it, even for short trips around the house.  Also, try not to rush).   Please come back for a follow-up appointment in 6 months.

## 2014-06-26 NOTE — Progress Notes (Signed)
we discussed code status.  pt requests full code, but would not want to be started or maintained on artificial life-support measures if there was not a reasonable chance of recovery 

## 2014-06-26 NOTE — Progress Notes (Signed)
Subjective:    Patient ID: Lucas [name], male    DOB: 10-26-1933, 81 y.o.   MRN: [id number]  HPI Pt was rx'ed for uti 2 mos ago.  Denies dysuria.  He says he has never had UTI before.   Past Medical History  Diagnosis Date  . DM 07/24/2008  . HYPERCHOLESTEROLEMIA 06/19/2008  . HYPOKALEMIA 07/24/2008  . HYPERTENSION 06/19/2008  . 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 TIA 06/26/2008  . ALLERGIC RHINITIS 01/25/2007    No past surgical history on file.  History   Social History  . Marital Status: Married    Spouse Name: N/A    Number of Children: N/A  . Years of Education: N/A   Occupational History  . Not on file.   Social History Main Topics  . Smoking status: Never Smoker   . Smokeless tobacco: Not on file  . Alcohol Use: Not on file  . Drug Use: Not on file  . Sexual Activity: Not on file   Other Topics Concern  . Not on file   Social History Narrative    Current Outpatient Prescriptions on File Prior to Visit  Medication Sig Dispense Refill  . aspirin 81 MG tablet Take 81 mg by mouth daily.      . furosemide (LASIX) 40 MG tablet Take 1 tablet (40 mg total) by mouth daily. 90 tablet 3  . lisinopril-hydrochlorothiazide (PRINZIDE,ZESTORETIC) 20-12.5 MG per tablet Take 1 tablet by mouth daily. 90 tablet 2  . sildenafil (VIAGRA) 100 MG tablet Take 1 tablet (100 mg total) by mouth as needed. 10 tablet 11  . simvastatin (ZOCOR) 80 MG tablet Take 1 tablet (80 mg total) by mouth at bedtime. 90 tablet 3   No current facility-administered medications on file prior to visit.    Allergies  Allergen Reactions  . Sulfonamide Derivatives     No family history on file.  BP 132/84 mmHg  Pulse 86  Temp(Src) 97.5 F (36.4 C) (Oral)  Ht  (1.676 m)  Wt 199 lb (90.266 kg)  BMI 32.13 kg/m2  SpO2 97%   Review of Systems Denies slow urinary stream    Objective:   Physical Exam VITAL SIGNS:  See  vs page GENERAL: no distress Gait: normal and steady.      UA pos for UTI    Assessment & Plan:  UTI, persistent.  Ref urol.   Subjective:   Patient here for Medicare annual wellness visit and management of other chronic and acute problems.     Risk factors: advanced age    Roster of Physicians Providing Medical Care to Patient:  See "snapshot"   Activities of Daily Living: In your present state of health, do you have any difficulty performing the following activities?:  Preparing food and eating?: No  Bathing yourself: No  Getting dressed: No  Using the toilet:No  Moving around from place to place: No  In the past year have you fallen or had a near fall?:No    Home Safety: Has smoke detector and wears seat belts. No firearms.  Diet and Exercise  Current exercise habits: pt says good Dietary issues discussed: pt reports a healthy diet   Depression Screen  Q1: Over the past two weeks, have you felt down, depressed or hopeless? no  Q2: Over the past two weeks, have you felt little interest or pleasure in doing things? no   The following portions of the patient's  history were reviewed and updated as appropriate: allergies, current medications, past family history, past medical history, past social history, past surgical history and problem list.   Review of Systems  Denies hearing loss, and visual loss Objective:   Vision:  Sees opthalmologist Hearing: grossly normal Body mass index:  See vs page Msk: pt easily and quickly performs "get-up-and-go" from a sitting position Cognitive Impairment Assessment: cognition, memory and judgment appear normal.  remembers 3/3 at 5 minutes.  excellent recall.  can easily read and write a sentence.  alert and oriented x 3   Assessment:   Medicare wellness utd on preventive parameters    Plan:   During the course of the visit the patient was educated and counseled about appropriate screening and preventive services including:        Fall prevention   Screening mammography  Bone densitometry screening  Diabetes screening  Nutrition counseling   Vaccines / LABS Zostavax / Pneumococcal Vaccine  today  PSA  Patient Instructions (the written plan) was given to the patient.

## 2014-07-01 ENCOUNTER — Telehealth: Payer: Self-pay | Admitting: Endocrinology

## 2014-07-01 NOTE — Telephone Encounter (Signed)
Patient wife stated her husband would like to be referred to her doctor Dr. Garnetta BuddyMartin W Webb, at [facility]

## 2014-07-02 NOTE — Telephone Encounter (Signed)
[facility] advised of note below.  

## 2014-07-16 DIAGNOSIS — H269 Unspecified cataract: Secondary | ICD-10-CM | POA: Diagnosis not present

## 2014-08-19 DIAGNOSIS — H2512 Age-related nuclear cataract, left eye: Secondary | ICD-10-CM | POA: Diagnosis not present

## 2014-08-19 DIAGNOSIS — H269 Unspecified cataract: Secondary | ICD-10-CM | POA: Diagnosis not present

## 2014-08-19 DIAGNOSIS — H25812 Combined forms of age-related cataract, left eye: Secondary | ICD-10-CM | POA: Diagnosis not present

## 2014-09-17 ENCOUNTER — Other Ambulatory Visit: Payer: Self-pay | Admitting: Endocrinology

## 2014-12-25 ENCOUNTER — Ambulatory Visit: Payer: Medicare Other | Admitting: Endocrinology

## 2015-03-21 ENCOUNTER — Other Ambulatory Visit: Payer: Self-pay | Admitting: Endocrinology

## 2015-05-20 ENCOUNTER — Telehealth: Payer: Self-pay | Admitting: Endocrinology

## 2015-05-20 ENCOUNTER — Ambulatory Visit (INDEPENDENT_AMBULATORY_CARE_PROVIDER_SITE_OTHER): Payer: Medicare Other | Admitting: Endocrinology

## 2015-05-20 ENCOUNTER — Encounter: Payer: Self-pay | Admitting: Endocrinology

## 2015-05-20 VITALS — BP 146/96 | HR 86 | Temp 97.6°F | Ht 66.0 in | Wt 209.0 lb

## 2015-05-20 DIAGNOSIS — E7800 Pure hypercholesterolemia, unspecified: Secondary | ICD-10-CM

## 2015-05-20 DIAGNOSIS — E119 Type 2 diabetes mellitus without complications: Secondary | ICD-10-CM

## 2015-05-20 DIAGNOSIS — N390 Urinary tract infection, site not specified: Secondary | ICD-10-CM

## 2015-05-20 DIAGNOSIS — M109 Gout, unspecified: Secondary | ICD-10-CM

## 2015-05-20 DIAGNOSIS — E876 Hypokalemia: Secondary | ICD-10-CM | POA: Diagnosis not present

## 2015-05-20 DIAGNOSIS — R972 Elevated prostate specific antigen [PSA]: Secondary | ICD-10-CM

## 2015-05-20 DIAGNOSIS — R319 Hematuria, unspecified: Secondary | ICD-10-CM

## 2015-05-20 LAB — URINALYSIS, ROUTINE W REFLEX MICROSCOPIC
BILIRUBIN URINE: NEGATIVE
KETONES UR: NEGATIVE
Nitrite: NEGATIVE
PH: 6.5 (ref 5.0–8.0)
Specific Gravity, Urine: 1.015 (ref 1.000–1.030)
Total Protein, Urine: 100 — AB
Urine Glucose: NEGATIVE
Urobilinogen, UA: 2 — AB (ref 0.0–1.0)

## 2015-05-20 LAB — HEPATIC FUNCTION PANEL
ALBUMIN: 3.4 g/dL — AB (ref 3.5–5.2)
ALT: 17 U/L (ref 0–53)
AST: 28 U/L (ref 0–37)
Alkaline Phosphatase: 124 U/L — ABNORMAL HIGH (ref 39–117)
BILIRUBIN DIRECT: 0.4 mg/dL — AB (ref 0.0–0.3)
TOTAL PROTEIN: 6.2 g/dL (ref 6.0–8.3)
Total Bilirubin: 1.2 mg/dL (ref 0.2–1.2)

## 2015-05-20 LAB — LIPID PANEL
CHOL/HDL RATIO: 5
CHOLESTEROL: 168 mg/dL (ref 0–200)
HDL: 32.8 mg/dL — ABNORMAL LOW (ref >39.00)
LDL Cholesterol: 117 mg/dL — ABNORMAL HIGH (ref 0–99)
NonHDL: 135.06
TRIGLYCERIDES: 90 mg/dL (ref 0.0–149.0)
VLDL: 18 mg/dL (ref 0.0–40.0)

## 2015-05-20 LAB — CBC WITH DIFFERENTIAL/PLATELET
Basophils Absolute: 0 10*3/uL (ref 0.0–0.1)
Basophils Relative: 0.6 % (ref 0.0–3.0)
EOS PCT: 1.1 % (ref 0.0–5.0)
Eosinophils Absolute: 0.1 10*3/uL (ref 0.0–0.7)
HCT: 45.8 % (ref 39.0–52.0)
Hemoglobin: 14.6 g/dL (ref 13.0–17.0)
LYMPHS ABS: 1.8 10*3/uL (ref 0.7–4.0)
Lymphocytes Relative: 30.6 % (ref 12.0–46.0)
MCHC: 31.8 g/dL (ref 30.0–36.0)
MCV: 86.7 fl (ref 78.0–100.0)
MONO ABS: 0.6 10*3/uL (ref 0.1–1.0)
MONOS PCT: 10.3 % (ref 3.0–12.0)
NEUTROS ABS: 3.4 10*3/uL (ref 1.4–7.7)
NEUTROS PCT: 57.4 % (ref 43.0–77.0)
PLATELETS: 211 10*3/uL (ref 150.0–400.0)
RBC: 5.28 Mil/uL (ref 4.22–5.81)
RDW: 17 % — AB (ref 11.5–15.5)
WBC: 5.9 10*3/uL (ref 4.0–10.5)

## 2015-05-20 LAB — BASIC METABOLIC PANEL
BUN: 17 mg/dL (ref 6–23)
CHLORIDE: 96 meq/L (ref 96–112)
CO2: 36 mEq/L — ABNORMAL HIGH (ref 19–32)
CREATININE: 1.05 mg/dL (ref 0.40–1.50)
Calcium: 9 mg/dL (ref 8.4–10.5)
GFR: 87.47 mL/min (ref >60.00)
Glucose, Bld: 87 mg/dL (ref 70–99)
Potassium: 3.4 mEq/L — ABNORMAL LOW (ref 3.5–5.1)
SODIUM: 141 meq/L (ref 135–145)

## 2015-05-20 LAB — MICROALBUMIN / CREATININE URINE RATIO
CREATININE, U: 121.5 mg/dL
MICROALB/CREAT RATIO: 58 mg/g — AB (ref 0.0–30.0)
Microalb, Ur: 70.5 mg/dL — ABNORMAL HIGH (ref 0.0–1.9)

## 2015-05-20 LAB — URIC ACID: URIC ACID, SERUM: 11.6 mg/dL — AB (ref 4.0–7.8)

## 2015-05-20 LAB — HEMOGLOBIN A1C: HEMOGLOBIN A1C: 5.9 % (ref 4.6–6.5)

## 2015-05-20 LAB — TSH: TSH: 2.17 u[IU]/mL (ref 0.35–4.50)

## 2015-05-20 LAB — PSA: PSA: 9.56 ng/mL — AB (ref 0.10–4.00)

## 2015-05-20 MED ORDER — SIMVASTATIN 80 MG PO TABS: 80.0000 mg | ORAL_TABLET | Freq: Every day | ORAL | Status: DC

## 2015-05-20 MED ORDER — HYDROCHLOROTHIAZIDE 12.5 MG PO TABS: 12.5000 mg | ORAL_TABLET | Freq: Every day | ORAL | Status: DC

## 2015-05-20 MED ORDER — ALLOPURINOL 300 MG PO TABS: 300.0000 mg | ORAL_TABLET | Freq: Every day | ORAL | Status: DC

## 2015-05-20 MED ORDER — LISINOPRIL 40 MG PO TABS: 40.0000 mg | ORAL_TABLET | Freq: Every day | ORAL | Status: DC

## 2015-05-20 NOTE — Progress Notes (Signed)
Subjective:    Patient ID: Lucas [name], male    DOB: 12/23/1933, 81 y.o.   MRN: [id number]  HPI  The state of at least three ongoing medical problems is addressed today, with interval history of each noted here: HTH: he takes zestoretic 1/day, but has not taken in the past 2 days.  He says the rx should be for 1 1/2 tabs per day.  pt states he feels well in general, except for fatigue.   Edema: he inconsistently takes lasix. Dyslipidemia: he denies chest pain.  Past Medical History  Diagnosis Date  . DM 07/24/2008  . HYPERCHOLESTEROLEMIA 06/19/2008  . HYPOKALEMIA 07/24/2008  . HYPERTENSION 06/19/2008  . 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 TIA 06/26/2008  . ALLERGIC RHINITIS 01/25/2007    No past surgical history on file.  Social History   Social History  . Marital Status: Married    Spouse Name: N/A  . Number of Children: N/A  . Years of Education: N/A   Occupational History  . Not on file.   Social History Main Topics  . Smoking status: Never Smoker   . Smokeless tobacco: Not on file  . Alcohol Use: Not on file  . Drug Use: Not on file  . Sexual Activity: Not on file   Other Topics Concern  . Not on file   Social History Narrative    Current Outpatient Prescriptions on File Prior to Visit  Medication Sig Dispense Refill  . aspirin 81 MG tablet Take 81 mg by mouth daily.      . furosemide (LASIX) 40 MG tablet Take 1 tablet (40 mg total) by mouth daily. 90 tablet 3  . sildenafil (VIAGRA) 100 MG tablet Take 1 tablet (100 mg total) by mouth as needed. 10 tablet 11   No current facility-administered medications on file prior to visit.    Allergies  Allergen Reactions  . Sulfonamide Derivatives     No family history on file.  BP 146/96 mmHg  Pulse 86  Temp(Src) 97.6 F (36.4 C) (Oral)  Ht 5\' 6"  (1.676 m)  Wt 209 lb (94.802 kg)  BMI 33.75 kg/m2  SpO2 92%   Review of Systems Denies  sob.  He has gained a few lbs    Objective:   Physical Exam VITAL SIGNS:  See vs page GENERAL: no distress Ext: trace bilat leg edema   Lab Results  Component Value Date   PSA 9.56* 05/20/2015   PSA 6.27* 04/16/2013   PSA 5.47* 03/15/2012       Assessment & Plan:  HTN: he needs increased rx. PSA elevation, worse. Hyperuricemia, worse Dyslipidemia: worse, ? med compliance. elev LFT, uncertain etiology     Patient is advised the following: Patient Instructions  i have sent 2 prescription to your pharmacy (this will increase 1 of the 2 meds in the BP pill, and leave the other the same. blood tests are requested for you today.  We'll let you know about the results.  Please come back for a "medicare wellness" appointment in 6 weeks (must be after 06/27/15).    addendum: please go back to see the urology specialist. you will receive a phone call, about a day and time for an appointment. Gout blood test is higher. i have sent a prescription to your pharmacy, for this. Cholesterol is high. i have sent a prescription to your pharmacy, to resume the simvastatin.  Liver is a little off.  All we need to do is recheck next time.

## 2015-05-20 NOTE — Telephone Encounter (Signed)
please call lab: i have added urine culture--thank you

## 2015-05-20 NOTE — Patient Instructions (Signed)
i have sent 2 prescription to your pharmacy (this will increase 1 of the 2 meds in the BP pill, and leave the other the same. blood tests are requested for you today.  We'll let you know about the results.  Please come back for a "medicare wellness" appointment in 6 weeks (must be after 06/27/15).

## 2015-05-21 ENCOUNTER — Other Ambulatory Visit: Payer: Medicare Other

## 2015-05-21 DIAGNOSIS — N390 Urinary tract infection, site not specified: Secondary | ICD-10-CM

## 2015-05-21 NOTE — Telephone Encounter (Signed)
I contacted the lab and requested the urine culture to be added on.

## 2015-05-22 ENCOUNTER — Telehealth: Payer: Self-pay

## 2015-05-22 LAB — URINE CULTURE
Colony Count: NO GROWTH
Organism ID, Bacteria: NO GROWTH

## 2015-05-22 NOTE — Telephone Encounter (Signed)
I contacted the pt's wife and advised of note below. She voiced understanding.  

## 2015-05-22 NOTE — Telephone Encounter (Signed)
No, you don't need to worry about this. The medication helps prevent heart attacks and strokes

## 2015-05-22 NOTE — Telephone Encounter (Signed)
Pt called wanting to verify if he should take the simvastatin.Pt stated previously when this medication had been prescribed for him it interacted with his BP med and cause swelling in his legs. Pt is concerned this will happen again. Please advise, Thanks!

## 2015-05-26 ENCOUNTER — Telehealth: Payer: Self-pay | Admitting: Endocrinology

## 2015-05-26 NOTE — Telephone Encounter (Signed)
hydrochlorothizide is causing him to swell worse please advise

## 2015-05-26 NOTE — Telephone Encounter (Signed)
I contacted the pt and left a voicemail advising him to call back and schedule an office visit.

## 2015-05-26 NOTE — Telephone Encounter (Signed)
See note below and please advise, Thanks! 

## 2015-05-26 NOTE — Telephone Encounter (Signed)
Ok, please come in for an appointment this week. This is because swelling, BP, and potassium are all related, and we'll need to recheck all.

## 2015-05-28 ENCOUNTER — Ambulatory Visit (INDEPENDENT_AMBULATORY_CARE_PROVIDER_SITE_OTHER): Payer: Medicare Other | Admitting: Endocrinology

## 2015-05-28 ENCOUNTER — Encounter: Payer: Self-pay | Admitting: Endocrinology

## 2015-05-28 VITALS — BP 146/93 | HR 93 | Temp 97.7°F | Ht 66.0 in | Wt 209.0 lb

## 2015-05-28 DIAGNOSIS — I10 Essential (primary) hypertension: Secondary | ICD-10-CM | POA: Diagnosis not present

## 2015-05-28 MED ORDER — LISINOPRIL-HYDROCHLOROTHIAZIDE 20-12.5 MG PO TABS: ORAL_TABLET | ORAL | Status: DC

## 2015-05-28 MED ORDER — POTASSIUM CHLORIDE CRYS ER 20 MEQ PO TBCR: 20.0000 meq | EXTENDED_RELEASE_TABLET | Freq: Every day | ORAL | Status: DC

## 2015-05-28 NOTE — Patient Instructions (Addendum)
i have sent 2 prescription to your pharmacy (1 to change back to the old BP pill, and 1 for potassium). Please resume the furosemide.  Please come back for a follow-up appointment in 2 weeks.

## 2015-05-28 NOTE — Progress Notes (Signed)
   Subjective:    Patient ID: Lucas CootsBobby R Barfoot, male    DOB: 03/21/1934, 81 y.o.   MRN: [id number]  HPI The state of at least three ongoing medical problems is addressed today, with interval history of each noted here:  Edema is worse recently.  HTN: he says the new dosages of lisinopril and HCTZ caused him to have a "sensation" in his head. Hypokalemia: denies cramps.  Past Medical History  Diagnosis Date  . DM 07/24/2008  . HYPERCHOLESTEROLEMIA 06/19/2008  . HYPOKALEMIA 07/24/2008  . HYPERTENSION 06/19/2008  . 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 TIA 06/26/2008  . ALLERGIC RHINITIS 01/25/2007    No past surgical history on file.  Social History   Social History  . Marital Status: Married    Spouse Name: N/A  . Number of Children: N/A  . Years of Education: N/A   Occupational History  . Not on file.   Social History Main Topics  . Smoking status: Never Smoker   . Smokeless tobacco: Not on file  . Alcohol Use: Not on file  . Drug Use: Not on file  . Sexual Activity: Not on file   Other Topics Concern  . Not on file   Social History Narrative    Current Outpatient Prescriptions on File Prior to Visit  Medication Sig Dispense Refill  . allopurinol (ZYLOPRIM) 300 MG tablet Take 1 tablet (300 mg total) by mouth daily. 30 tablet 6  . aspirin 81 MG tablet Take 81 mg by mouth daily.      . furosemide (LASIX) 40 MG tablet Take 1 tablet (40 mg total) by mouth daily. 90 tablet 3  . sildenafil (VIAGRA) 100 MG tablet Take 1 tablet (100 mg total) by mouth as needed. 10 tablet 11  . simvastatin (ZOCOR) 80 MG tablet Take 1 tablet (80 mg total) by mouth at bedtime. (Patient not taking: Reported on 05/28/2015) 90 tablet 3   No current facility-administered medications on file prior to visit.    Allergies  Allergen Reactions  . Sulfonamide Derivatives     No family history on file.  BP 146/93 mmHg  Pulse 93   Temp(Src) 97.7 F (36.5 C) (Oral)  Ht 5\' 6"  (1.676 m)  Wt 209 lb (94.802 kg)  BMI 33.75 kg/m2  SpO2 95%  Review of Systems Denies sob and LOC.      Objective:   Physical Exam VITAL SIGNS:  See vs page GENERAL: no distress LUNGS:  Clear to auscultation Ext: 2+ bilat leg edema.         Assessment & Plan:  Edema, worse HTN: therapy limited by noncompliance.  i'll do the best i can. Hypokalemia: new  Patient is advised the following: Patient Instructions  i have sent 2 prescription to your pharmacy (1 to change back to the old BP pill, and 1 for potassium). Please resume the furosemide.  Please come back for a follow-up appointment in 2 weeks.

## 2015-06-11 ENCOUNTER — Ambulatory Visit (INDEPENDENT_AMBULATORY_CARE_PROVIDER_SITE_OTHER): Payer: Medicare Other | Admitting: Endocrinology

## 2015-06-11 ENCOUNTER — Telehealth: Payer: Self-pay | Admitting: Endocrinology

## 2015-06-11 ENCOUNTER — Encounter: Payer: Self-pay | Admitting: Endocrinology

## 2015-06-11 VITALS — BP 146/78 | HR 79 | Temp 98.0°F | Ht 66.0 in | Wt 201.0 lb

## 2015-06-11 DIAGNOSIS — I10 Essential (primary) hypertension: Secondary | ICD-10-CM

## 2015-06-11 DIAGNOSIS — R609 Edema, unspecified: Secondary | ICD-10-CM

## 2015-06-11 DIAGNOSIS — T887XXA Unspecified adverse effect of drug or medicament, initial encounter: Secondary | ICD-10-CM

## 2015-06-11 DIAGNOSIS — E876 Hypokalemia: Secondary | ICD-10-CM | POA: Diagnosis not present

## 2015-06-11 DIAGNOSIS — T50905A Adverse effect of unspecified drugs, medicaments and biological substances, initial encounter: Secondary | ICD-10-CM

## 2015-06-11 LAB — HEPATIC FUNCTION PANEL
ALT: 17 U/L (ref 0–53)
AST: 28 U/L (ref 0–37)
Albumin: 3.4 g/dL — ABNORMAL LOW (ref 3.5–5.2)
Alkaline Phosphatase: 118 U/L — ABNORMAL HIGH (ref 39–117)
BILIRUBIN DIRECT: 0.4 mg/dL — AB (ref 0.0–0.3)
BILIRUBIN TOTAL: 1.3 mg/dL — AB (ref 0.2–1.2)
Total Protein: 6.6 g/dL (ref 6.0–8.3)

## 2015-06-11 LAB — BASIC METABOLIC PANEL
BUN: 16 mg/dL (ref 6–23)
CO2: 35 mEq/L — ABNORMAL HIGH (ref 19–32)
Calcium: 9.5 mg/dL (ref 8.4–10.5)
Chloride: 97 mEq/L (ref 96–112)
Creatinine, Ser: 1.04 mg/dL (ref 0.40–1.50)
GFR: 88.43 mL/min (ref >60.00)
Glucose, Bld: 84 mg/dL (ref 70–99)
POTASSIUM: 2.9 meq/L — AB (ref 3.5–5.1)
SODIUM: 141 meq/L (ref 135–145)

## 2015-06-11 MED ORDER — FUROSEMIDE 40 MG PO TABS: 40.0000 mg | ORAL_TABLET | Freq: Every day | ORAL | Status: DC

## 2015-06-11 MED ORDER — POTASSIUM CHLORIDE CRYS ER 20 MEQ PO TBCR: 20.0000 meq | EXTENDED_RELEASE_TABLET | Freq: Three times a day (TID) | ORAL | Status: DC

## 2015-06-11 NOTE — Telephone Encounter (Signed)
Thank you Potassium is low. Please increase potassium to 3 pills per day.  You can take all at once, or spread out 3 times per day. Also, please add the furosemide.  This will help the swelling. i have sent prescriptions to your pharmacy, for both Please come back for a follow-up appointment in 2 weeks.

## 2015-06-11 NOTE — Progress Notes (Signed)
   Subjective:    Patient ID: Lucas CootsBobby R Leeson, male    DOB: 07/28/1933, 82 y.o.   MRN: [id number]  HPI The state of at least three ongoing medical problems is addressed today, with interval history of each noted here:  Edema: persists.  He is uncertain if he takes the lasix HTN: denies cough Hypokalemia: denies cramps.  [facility] Past Medical History  Diagnosis Date  . DM 07/24/2008  . HYPERCHOLESTEROLEMIA 06/19/2008  . HYPOKALEMIA 07/24/2008  . HYPERTENSION 06/19/2008  . 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 TIA 06/26/2008  . ALLERGIC RHINITIS 01/25/2007    No past surgical history on file.  Social History   Social History  . Marital Status: Married    Spouse Name: N/A  . Number of Children: N/A  . Years of Education: N/A   Occupational History  . Not on file.   Social History Main Topics  . Smoking status: Never Smoker   . Smokeless tobacco: Not on file  . Alcohol Use: Not on file  . Drug Use: Not on file  . Sexual Activity: Not on file   Other Topics Concern  . Not on file   Social History Narrative    Current Outpatient Prescriptions on File Prior to Visit  Medication Sig Dispense Refill  . allopurinol (ZYLOPRIM) 300 MG tablet Take 1 tablet (300 mg total) by mouth daily. 30 tablet 6  . aspirin 81 MG tablet Take 81 mg by mouth daily.      Marland Kitchen. lisinopril-hydrochlorothiazide (PRINZIDE,ZESTORETIC) 20-12.5 MG tablet 1 1/2 tabs, once a day 45 tablet 11  . sildenafil (VIAGRA) 100 MG tablet Take 1 tablet (100 mg total) by mouth as needed. 10 tablet 11  . simvastatin (ZOCOR) 80 MG tablet Take 1 tablet (80 mg total) by mouth at bedtime. 90 tablet 3   No current facility-administered medications on file prior to visit.    Allergies  Allergen Reactions  . Sulfonamide Derivatives     No family history on file.  BP 146/78 mmHg  Pulse 79  Temp(Src) 98 F (36.7 C) (Oral)   Ht 5\' 6"  (1.676 m)  Wt 201 lb (91.173 kg)  BMI 32.46 kg/m2  SpO2 92%   Review of Systems Denies sob and weight change.    Objective:   Physical Exam VITAL SIGNS:  See vs page GENERAL: no distress Ext: 2+ bilat leg edema.     Lab Results  Component Value Date   CREATININE 1.04 06/11/2015   BUN 16 06/11/2015   NA 141 06/11/2015   K 2.9* 06/11/2015   CL 97 06/11/2015   CO2 35* 06/11/2015      Assessment & Plan:  HTN: he needs increased rx Hypokalemia: he needs increased rx Edema: he needs increased rx  Patient is advised the following: Patient Instructions  When you get home today, please call us with the medications you are taking. blood tests are requested for you today.  We'll let you know about the results. Please come back for a "medicare wellness" appointment in 1 month.

## 2015-06-11 NOTE — Telephone Encounter (Signed)
See note below. I spoke with the pt's wife and advised the simvastatin is used to help with the pt's high cholesterol. She voiced understanding. Pt's wife had no further questions at this time.

## 2015-06-11 NOTE — Patient Instructions (Signed)
When you get home today, please call us with the medications you are taking. blood tests are requested for you today.  We'll let you know about the results. Please come back for a "medicare wellness" appointment in 1 month.

## 2015-06-11 NOTE — Telephone Encounter (Signed)
Patient was advised by Dr. Everardo AllEllison to call and give his current medications  Allopurinol 300 mg  1 tab daily  Lisinopril HCT 20/5.5 1 1/2 tabs daily  Potassium 20 MEQ  Patient has question regarding this medication  Simvastatin 80 mg 1 tab by mouth at bedtime patient has question regarding this medication   Please advise    Thank you

## 2015-06-12 NOTE — Telephone Encounter (Signed)
I contacted the pt's wife and advised of note below and she verbalized understanding. Pt scheduled for 07/01/2015 at 1 pm.

## 2015-07-01 ENCOUNTER — Encounter: Payer: Self-pay | Admitting: Endocrinology

## 2015-07-01 ENCOUNTER — Ambulatory Visit (INDEPENDENT_AMBULATORY_CARE_PROVIDER_SITE_OTHER): Payer: Medicare Other | Admitting: Endocrinology

## 2015-07-01 VITALS — BP 118/70 | HR 102 | Temp 98.2°F | Ht 66.0 in | Wt 190.0 lb

## 2015-07-01 DIAGNOSIS — K769 Liver disease, unspecified: Secondary | ICD-10-CM | POA: Diagnosis not present

## 2015-07-01 DIAGNOSIS — E7800 Pure hypercholesterolemia, unspecified: Secondary | ICD-10-CM

## 2015-07-01 DIAGNOSIS — M109 Gout, unspecified: Secondary | ICD-10-CM | POA: Diagnosis not present

## 2015-07-01 DIAGNOSIS — Z0000 Encounter for general adult medical examination without abnormal findings: Secondary | ICD-10-CM

## 2015-07-01 DIAGNOSIS — E876 Hypokalemia: Secondary | ICD-10-CM

## 2015-07-01 LAB — LIPID PANEL
CHOLESTEROL: 199 mg/dL (ref 0–200)
HDL: 43.1 mg/dL (ref >39.00)
LDL CALC: 136 mg/dL — AB (ref 0–99)
NONHDL: 156.27
Total CHOL/HDL Ratio: 5
Triglycerides: 99 mg/dL (ref 0.0–149.0)
VLDL: 19.8 mg/dL (ref 0.0–40.0)

## 2015-07-01 LAB — HEPATIC FUNCTION PANEL
ALBUMIN: 3.6 g/dL (ref 3.5–5.2)
ALT: 11 U/L (ref 0–53)
AST: 18 U/L (ref 0–37)
Alkaline Phosphatase: 111 U/L (ref 39–117)
Bilirubin, Direct: 0.2 mg/dL (ref 0.0–0.3)
Total Bilirubin: 1.1 mg/dL (ref 0.2–1.2)
Total Protein: 7.2 g/dL (ref 6.0–8.3)

## 2015-07-01 LAB — BASIC METABOLIC PANEL
BUN: 16 mg/dL (ref 6–23)
CALCIUM: 9.6 mg/dL (ref 8.4–10.5)
CHLORIDE: 99 meq/L (ref 96–112)
CO2: 29 meq/L (ref 19–32)
CREATININE: 1.08 mg/dL (ref 0.40–1.50)
GFR: 84.65 mL/min (ref >60.00)
Glucose, Bld: 104 mg/dL — ABNORMAL HIGH (ref 70–99)
Potassium: 4.2 mEq/L (ref 3.5–5.1)
Sodium: 136 mEq/L (ref 135–145)

## 2015-07-01 MED ORDER — SIMVASTATIN 80 MG PO TABS: 80.0000 mg | ORAL_TABLET | Freq: Every day | ORAL | Status: DC

## 2015-07-01 NOTE — Progress Notes (Signed)
Subjective:    Patient ID: Lucas [name], male    DOB: Jul 27, 1933, 82 y.o.   MRN: [id number]  HPI The state of at least three ongoing medical problems is addressed today, with interval history of each noted here:  Edema: is much better.  He he takes the lasix as rx'ed HTN: denies sob Hypokalemia: denies cramps.   Past Medical History  Diagnosis Date  . DM 07/24/2008  . HYPERCHOLESTEROLEMIA 06/19/2008  . HYPOKALEMIA 07/24/2008  . HYPERTENSION 06/19/2008  . 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 TIA 06/26/2008  . ALLERGIC RHINITIS 01/25/2007    No past surgical history on file.  Social History   Social History  . Marital Status: Married    Spouse Name: N/A  . Number of Children: N/A  . Years of Education: N/A   Occupational History  . Not on file.   Social History Main Topics  . Smoking status: Never Smoker   . Smokeless tobacco: Not on file  . Alcohol Use: No  . Drug Use: Not on file  . Sexual Activity: Not on file   Other Topics Concern  . Not on file   Social History Narrative    Current Outpatient Prescriptions on File Prior to Visit  Medication Sig Dispense Refill  . allopurinol (ZYLOPRIM) 300 MG tablet Take 1 tablet (300 mg total) by mouth daily. 30 tablet 6  . aspirin 81 MG tablet Take 81 mg by mouth daily.      . furosemide (LASIX) 40 MG tablet Take 1 tablet (40 mg total) by mouth daily. 90 tablet 3  . lisinopril-hydrochlorothiazide (PRINZIDE,ZESTORETIC) 20-12.5 MG tablet 1 1/2 tabs, once a day 45 tablet 11  . potassium chloride SA (K-DUR,KLOR-CON) 20 MEQ tablet Take 1 tablet (20 mEq total) by mouth 3 (three) times daily. 90 tablet 11   No current facility-administered medications on file prior to visit.    Allergies  Allergen Reactions  . Sulfonamide Derivatives Nausea And Vomiting    No family history on file.  BP 118/70 mmHg  Pulse 102  Temp(Src) 98.2 F (36.8 C) (Oral)  Ht   (1.676 m)  Wt 190 lb (86.183 kg)  BMI 30.68 kg/m2  SpO2 92%   Review of Systems He has lost a few lbs.  No recent gout sxs    Objective:   Physical Exam VITAL SIGNS:  See vs page GENERAL: no distress Ext: 1+ bilat leg edema.  Lab Results  Component Value Date   CREATININE 1.29* 07/02/2015   BUN 17 07/02/2015   NA 132* 07/02/2015   K 3.7 07/02/2015   CL 93* 07/02/2015   CO2 23 07/02/2015   Lab Results  Component Value Date   CHOL 199 07/01/2015   HDL 43.10 07/01/2015   LDLCALC 136* 07/01/2015   LDLDIRECT 178.6 04/16/2013   TRIG 99.0 07/01/2015   CHOLHDL 5 07/01/2015       Assessment & Plan:  Hypokalemia: well-replaced.  Please continue the same medication HTN: well-controlled.  Please continue the same medication. Edema: much better: Please continue the same medication. Dyslipidemia: therapy is probably limited by noncompliance.  i refilled zocor.    Subjective:   Patient here for Medicare annual wellness visit and management of other chronic and acute problems.     Risk factors: advanced age    Roster of Physicians Providing Medical Care to Patient:  See "snapshot"   Activities of Daily Living: In your present state  of health, do you have any difficulty performing the following activities?:  Preparing food and eating?: No  Bathing yourself: No  Getting dressed: No  Using the toilet:No  Moving around from place to place: No  In the past year have you fallen or had a near fall?:No    Home Safety: Has smoke detector and wears seat belts. No firearms.   Diet and Exercise  Current exercise habits: pt says good Dietary issues discussed: pt reports a healthy diet   Depression Screen  Q1: Over the past two weeks, have you felt down, depressed or hopeless? no  Q2: Over the past two weeks, have you felt little interest or pleasure in doing things? no   The following portions of the patient's history were reviewed and updated as appropriate: allergies,  current medications, past family history, past medical history, past social history, past surgical history and problem list.   Review of Systems  Denies hearing loss, and visual loss Objective:   Vision:  Curator, but does not recall name Hearing: grossly normal Body mass index:  See vs page Msk: pt easily and quickly performs "get-up-and-go" from a sitting position Cognitive Impairment Assessment: cognition, memory and judgment appear normal.  remembers 3/3 at 5 minutes.  excellent recall.  can easily read and write a sentence.  alert and oriented x 3   Assessment:   Medicare wellness utd on preventive parameters    Plan:   During the course of the visit the patient was educated and counseled about appropriate screening and preventive services including:        Fall prevention    Diabetes screening  Nutrition counseling   Vaccines / LABS Zostavax / Pneumococcal Vaccine  today    Patient Instructions (the written plan) was given to the patient.  good diet and exercise significantly improve the control of your diabetes.  please let me know if you wish to be referred to a dietician.  high blood sugar is very risky to your health.  you should see an eye doctor and dentist every year.  It is very important to get all recommended vaccinations.  please consider these measures for your health:  minimize alcohol.  do not use tobacco products.  have a colonoscopy at least every 10 years from age 69.  keep firearms safely stored.  always use seat belts.  have working smoke alarms in your home.  see an eye doctor and dentist regularly.  never drive under the influence of alcohol or drugs (including prescription drugs).

## 2015-07-01 NOTE — Progress Notes (Signed)
we discussed code status.  pt requests DNR 

## 2015-07-01 NOTE — Patient Instructions (Addendum)
please consider these measures for your health:  minimize alcohol.  do not use tobacco products.  have a colonoscopy at least every 10 years from age 80.  keep firearms safely stored.  always use seat belts.  have working smoke alarms in your home.  see an eye doctor and dentist regularly.  never drive under the influence of alcohol or drugs (including prescription drugs).   it is critically important to prevent falling down (keep floor areas well-lit, dry, and free of loose objects.  If you have a cane, walker, or wheelchair, you should use it, even for short trips around the house.  Also, try not to rush). blood tests are requested for you today.  We'll let you know about the results.  Please come back for a follow-up appointment in 3 months.

## 2015-07-02 ENCOUNTER — Encounter (HOSPITAL_COMMUNITY): Payer: Self-pay | Admitting: *Deleted

## 2015-07-02 ENCOUNTER — Emergency Department (HOSPITAL_COMMUNITY): Payer: Medicare Other

## 2015-07-02 ENCOUNTER — Ambulatory Visit: Payer: Medicare Other | Admitting: Endocrinology

## 2015-07-02 ENCOUNTER — Emergency Department (HOSPITAL_COMMUNITY)
Admission: EM | Admit: 2015-07-02 | Discharge: 2015-07-02 | Disposition: A | Discharge status: Home / Self Care | Payer: Medicare Other | Attending: Emergency Medicine | Admitting: Emergency Medicine

## 2015-07-02 DIAGNOSIS — Z7982 Long term (current) use of aspirin: Secondary | ICD-10-CM | POA: Diagnosis not present

## 2015-07-02 DIAGNOSIS — R112 Nausea with vomiting, unspecified: Secondary | ICD-10-CM | POA: Insufficient documentation

## 2015-07-02 DIAGNOSIS — E876 Hypokalemia: Secondary | ICD-10-CM | POA: Diagnosis not present

## 2015-07-02 DIAGNOSIS — I10 Essential (primary) hypertension: Secondary | ICD-10-CM | POA: Diagnosis not present

## 2015-07-02 DIAGNOSIS — E119 Type 2 diabetes mellitus without complications: Secondary | ICD-10-CM | POA: Insufficient documentation

## 2015-07-02 DIAGNOSIS — R109 Unspecified abdominal pain: Secondary | ICD-10-CM | POA: Diagnosis present

## 2015-07-02 DIAGNOSIS — K469 Unspecified abdominal hernia without obstruction or gangrene: Secondary | ICD-10-CM | POA: Insufficient documentation

## 2015-07-02 DIAGNOSIS — Z8709 Personal history of other diseases of the respiratory system: Secondary | ICD-10-CM | POA: Insufficient documentation

## 2015-07-02 DIAGNOSIS — Z8673 Personal history of transient ischemic attack (TIA), and cerebral infarction without residual deficits: Secondary | ICD-10-CM | POA: Diagnosis not present

## 2015-07-02 DIAGNOSIS — R1013 Epigastric pain: Secondary | ICD-10-CM | POA: Diagnosis not present

## 2015-07-02 DIAGNOSIS — Z79899 Other long term (current) drug therapy: Secondary | ICD-10-CM | POA: Insufficient documentation

## 2015-07-02 DIAGNOSIS — Z87438 Personal history of other diseases of male genital organs: Secondary | ICD-10-CM | POA: Insufficient documentation

## 2015-07-02 LAB — CBC
HEMATOCRIT: 52 % (ref 39.0–52.0)
Hemoglobin: 18.2 g/dL — ABNORMAL HIGH (ref 13.0–17.0)
MCH: 29 pg (ref 26.0–34.0)
MCHC: 35 g/dL (ref 30.0–36.0)
MCV: 82.8 fL (ref 78.0–100.0)
PLATELETS: 165 10*3/uL (ref 150–400)
RBC: 6.28 MIL/uL — ABNORMAL HIGH (ref 4.22–5.81)
RDW: 15.5 % (ref 11.5–15.5)
WBC: 8.8 10*3/uL (ref 4.0–10.5)

## 2015-07-02 LAB — URINE MICROSCOPIC-ADD ON

## 2015-07-02 LAB — LIPASE, BLOOD: LIPASE: 24 U/L (ref 11–51)

## 2015-07-02 LAB — COMPREHENSIVE METABOLIC PANEL
ALBUMIN: 4 g/dL (ref 3.5–5.0)
ALT: 14 U/L — AB (ref 17–63)
AST: 23 U/L (ref 15–41)
Alkaline Phosphatase: 133 U/L — ABNORMAL HIGH (ref 38–126)
Anion gap: 16 — ABNORMAL HIGH (ref 5–15)
BUN: 17 mg/dL (ref 6–20)
CHLORIDE: 93 mmol/L — AB (ref 101–111)
CO2: 23 mmol/L (ref 22–32)
CREATININE: 1.29 mg/dL — AB (ref 0.61–1.24)
Calcium: 9.8 mg/dL (ref 8.9–10.3)
GFR calc Af Amer: 59 mL/min — ABNORMAL LOW (ref >60)
GFR calc non Af Amer: 51 mL/min — ABNORMAL LOW (ref >60)
Glucose, Bld: 141 mg/dL — ABNORMAL HIGH (ref 65–99)
POTASSIUM: 3.7 mmol/L (ref 3.5–5.1)
SODIUM: 132 mmol/L — AB (ref 135–145)
Total Bilirubin: 1.3 mg/dL — ABNORMAL HIGH (ref 0.3–1.2)
Total Protein: 8.5 g/dL — ABNORMAL HIGH (ref 6.5–8.1)

## 2015-07-02 LAB — URINALYSIS, ROUTINE W REFLEX MICROSCOPIC
Bilirubin Urine: NEGATIVE
GLUCOSE, UA: NEGATIVE mg/dL
KETONES UR: NEGATIVE mg/dL
Nitrite: NEGATIVE
PH: 6 (ref 5.0–8.0)
PROTEIN: NEGATIVE mg/dL
Specific Gravity, Urine: 1.046 — ABNORMAL HIGH (ref 1.005–1.030)

## 2015-07-02 LAB — I-STAT CG4 LACTIC ACID, ED
Lactic Acid, Venous: 3.57 mmol/L (ref 0.5–2.0)
Lactic Acid, Venous: 2.05 mmol/L (ref 0.5–2.0)

## 2015-07-02 MED ORDER — ONDANSETRON 4 MG PO TBDP
4.0000 mg | ORAL_TABLET | Freq: Once | ORAL | Status: AC | PRN
  Administered 2015-07-02: 4 mg via ORAL

## 2015-07-02 MED ORDER — SODIUM CHLORIDE 0.9 % IV BOLUS (SEPSIS)
1000.0000 mL | Freq: Once | INTRAVENOUS | Status: AC
  Administered 2015-07-02: 1000 mL via INTRAVENOUS

## 2015-07-02 MED ORDER — IOHEXOL 300 MG/ML  SOLN
100.0000 mL | Freq: Once | INTRAMUSCULAR | Status: AC | PRN
  Administered 2015-07-02: 100 mL via INTRAVENOUS

## 2015-07-02 MED ORDER — ONDANSETRON 4 MG PO TBDP
ORAL_TABLET | ORAL | Status: AC
  Filled 2015-07-02: qty 1

## 2015-07-02 NOTE — ED Notes (Signed)
Pt states that he ate meatloaf for dinner and has vomited x 8 since. pts wife states that she ate meatloaf and has no issues. She gave him pepto-bismal, chipped ice and baking soda with no relief.

## 2015-07-02 NOTE — ED Provider Notes (Signed)
CSN: [id number]     Arrival date & time 07/02/15  0201 History  By signing my name below, I, Bethel Born, attest that this documentation has been prepared under the direction and in the presence of Tomasita Crumble, MD. Electronically Signed: Bethel Born, ED Scribe. 07/02/2015. 3:09 AM     Chief Complaint  Patient presents with  . Abdominal Pain   The history is provided by the patient. No language interpreter was used.   Lucas [name] is a 82 y.o. male with history of DM, hypercholesteremia, HTN, and TIA who presents to the Emergency Department complaining of an episode of constant "gas"-like mid abdominal pain with onset last night near 8 PM. The pain has improved since initial onset.  Pt states that the pain started 3 hours after he ate meatloaf, mashed potatoes, peanuts, and orange flavored candy. Associated symptoms include sweating,  Nausea, and 11 episodes of non-bloody emesis. Pepto Bismol and chipped ice with baking soda provided insufficient relief in symptoms at home.  Pt denies fever, diarrhea, and difficulty urinating. Pt denies history of abdominal surgery.   Past Medical History  Diagnosis Date  . DM 07/24/2008  . HYPERCHOLESTEROLEMIA 06/19/2008  . HYPOKALEMIA 07/24/2008  . HYPERTENSION 06/19/2008  . 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 TIA 06/26/2008  . ALLERGIC RHINITIS 01/25/2007   History reviewed. No pertinent past surgical history. No family history on file. Social History  Substance Use Topics  . Smoking status: Never Smoker   . Smokeless tobacco: None  . Alcohol Use: No    Review of Systems  10 Systems reviewed and all are negative for acute change except as noted in the HPI   Allergies  Sulfonamide derivatives  Home Medications   Prior to Admission medications   Medication Sig Start Date End Date Taking? Authorizing Provider  allopurinol (ZYLOPRIM) 300 MG tablet Take 1 tablet (300  mg total) by mouth daily. 05/20/15  Yes Romero Belling, MD  aspirin 81 MG tablet Take 81 mg by mouth daily.     Yes Historical Provider, MD  furosemide (LASIX) 40 MG tablet Take 1 tablet (40 mg total) by mouth daily. 06/11/15  Yes Romero Belling, MD  lisinopril-hydrochlorothiazide (PRINZIDE,ZESTORETIC) 20-12.5 MG tablet 1 1/2 tabs, once a day 05/28/15  Yes Romero Belling, MD  potassium chloride SA (K-DUR,KLOR-CON) 20 MEQ tablet Take 1 tablet (20 mEq total) by mouth 3 (three) times daily. 06/11/15  Yes Romero Belling, MD  simvastatin (ZOCOR) 80 MG tablet Take 1 tablet (80 mg total) by mouth at bedtime. Patient not taking: Reported on 07/02/2015 07/01/15   Romero Belling, MD   BP 122/75 mmHg  Pulse 100  Temp(Src) 97.5 F (36.4 C) (Oral)  Resp 22  SpO2 94% Physical Exam  Constitutional: He is oriented to person, place, and time. Vital signs are normal. He appears well-developed and well-nourished.  Non-toxic appearance. He does not appear ill. No distress.  HENT:  Head: Normocephalic and atraumatic.  Nose: Nose normal.  Mouth/Throat: Oropharynx is clear and moist. No oropharyngeal exudate.  Eyes: Conjunctivae and EOM are normal. Pupils are equal, round, and reactive to light. No scleral icterus.  Neck: Normal range of motion. Neck supple. No tracheal deviation, no edema, no erythema and normal range of motion present. No thyroid mass and no thyromegaly present.  Cardiovascular: Normal rate, regular rhythm, S1 normal, S2 normal, normal heart sounds, intact distal pulses and normal pulses.  Exam reveals no gallop and no  friction rub.   No murmur heard. Pulmonary/Chest: Effort normal and breath sounds normal. No respiratory distress. He has no wheezes. He has no rhonchi. He has no rales.  Abdominal: Soft. Normal appearance and bowel sounds are normal. He exhibits no distension, no ascites and no mass. There is no hepatosplenomegaly. There is no tenderness. There is no rebound, no guarding and no CVA tenderness. A  hernia is present.  Mid epigastric easily reducible hernia  Musculoskeletal: Normal range of motion. He exhibits edema. He exhibits no tenderness.  BLE edema   Lymphadenopathy:    He has no cervical adenopathy.  Neurological: He is alert and oriented to person, place, and time. He has normal strength. No cranial nerve deficit or sensory deficit.  Skin: Skin is warm, dry and intact. No petechiae and no rash noted. He is not diaphoretic. No erythema. No pallor.  Psychiatric: He has a normal mood and affect. His behavior is normal. Judgment normal.  Nursing note and vitals reviewed.   ED Course  Procedures (including critical care time) DIAGNOSTIC STUDIES: Oxygen Saturation is 94% on RA, adequate by my interpretation.    COORDINATION OF CARE: 2:56 AM Discussed treatment plan which includes lab work, CT A/P with contrast, Zofran, and IVF with pt at bedside and pt agreed to plan.  Labs Review Labs Reviewed  COMPREHENSIVE METABOLIC PANEL - Abnormal; Notable for the following:    Sodium 132 (*)    Chloride 93 (*)    Glucose, Bld 141 (*)    Creatinine, Ser 1.29 (*)    Total Protein 8.5 (*)    ALT 14 (*)    Alkaline Phosphatase 133 (*)    Total Bilirubin 1.3 (*)    GFR calc non Af Amer 51 (*)    GFR calc Af Amer 59 (*)    Anion gap 16 (*)    All other components within normal limits  CBC - Abnormal; Notable for the following:    RBC 6.28 (*)    Hemoglobin 18.2 (*)    All other components within normal limits  I-STAT CG4 LACTIC ACID, ED - Abnormal; Notable for the following:    Lactic Acid, Venous 3.57 (*)    All other components within normal limits  I-STAT CG4 LACTIC ACID, ED - Abnormal; Notable for the following:    Lactic Acid, Venous 2.05 (*)    All other components within normal limits  LIPASE, BLOOD  URINALYSIS, ROUTINE W REFLEX MICROSCOPIC (NOT AT [facility])    Imaging Review Ct Abdomen Pelvis W Contrast  07/02/2015  CLINICAL DATA:  Acute onset of epigastric abdominal  pain, nausea and vomiting. Initial encounter. EXAM: CT ABDOMEN AND PELVIS WITH CONTRAST TECHNIQUE: Multidetector CT imaging of the abdomen and pelvis was performed using the standard protocol following bolus administration of intravenous contrast. CONTRAST:  OMNIPAQUE IOHEXOL 300 MG/ML  SOLN COMPARISON:  None. FINDINGS: The visualized lung bases are clear. A small calcified granuloma is noted at the right hepatic lobe. The liver and spleen are otherwise unremarkable. The gallbladder is within normal limits. The pancreas and adrenal glands are unremarkable. Bilateral renal cysts measure up to 4.2 cm in size. The kidneys are otherwise unremarkable. There is no evidence of hydronephrosis. No renal or ureteral stones are identified. No perinephric stranding is appreciated. No free fluid is identified. The small bowel is unremarkable in appearance. The stomach is within normal limits. No acute vascular abnormalities are seen. Scattered calcification is noted along the abdominal aorta and its branches, with  mild ectasia of the distal abdominal aorta. Minimal associated mural thrombus is noted. The appendix is normal in caliber, without evidence of appendicitis. Scattered diverticulosis is noted along the distal descending and sigmoid colon, without evidence of diverticulitis. The bladder is mildly distended and grossly unremarkable. The prostate remains normal in size. No inguinal lymphadenopathy is seen. No acute osseous abnormalities are identified. Facet disease is noted along the lumbar spine. Vacuum phenomenon is noted at L4-L5. IMPRESSION: 1. No acute abnormality seen to explain the patient's symptoms. 2. Bilateral renal cysts noted. 3. Scattered calcification along the abdominal aorta and its branches, with minimal mural thrombus but no evidence of luminal narrowing. 4. Scattered diverticulosis along the distal descending and sigmoid colon, without evidence of diverticulitis. Electronically Signed   By:  Roanna Raider M.D.   On: 07/02/2015 04:36   I have personally reviewed and evaluated these images and lab results as part of my medical decision-making.   EKG Interpretation None      MDM   Final diagnoses:  None      patient presents emergency department for abdominal pain and multiple episodes of vomiting. This occurred after dinner. He continues to have abdominal pain however CT scan is warranted given his age and risk factors for possible intra-abdominal pathology. Laboratory studies shows a lactate of 3.57, likely due to his vomiting. He is also dehydrated with a hemoglobin of 18. He was given a liter of IV fluids and lactate is now 2.05. CT scan does not show any acute causes of his symptoms. This is likely from the meal. He's had no recurrence of vomiting in the emergency department. Patient appears well in no acute distress, vital signs were within his normal limits and he is safe for discharge with primary care follow-up within the next 3 days.  I personally performed the services described in this documentation, which was scribed in my presence. The recorded information has been reviewed and is accurate.     Tomasita Crumble, MD 07/02/15 [phone]

## 2015-07-02 NOTE — ED Notes (Signed)
Patient transported to CT 

## 2015-07-02 NOTE — Discharge Instructions (Signed)
Nausea and Vomiting Lucas [name],  Your CT scan did not show any cause for your abdominal pain and vomiting. See your primary care doctor within 3 days for close follow up.  If symptoms worsen, come back to the ED immediatly.  Thank you. Nausea means you feel sick to your stomach. Throwing up (vomiting) is a reflex where stomach contents come out of your mouth. HOME CARE   Take medicine as told by your doctor.  Do not force yourself to eat. However, you do need to drink fluids.  If you feel like eating, eat a normal diet as told by your doctor.  Eat rice, wheat, potatoes, bread, lean meats, yogurt, fruits, and vegetables.  Avoid high-fat foods.  Drink enough fluids to keep your pee (urine) clear or pale yellow.  Ask your doctor how to replace body fluid losses (rehydrate). Signs of body fluid loss (dehydration) include:  Feeling very thirsty.  Dry lips and mouth.  Feeling dizzy.  Dark pee.  Peeing less than normal.  Feeling confused.  Fast breathing or heart rate. GET HELP RIGHT AWAY IF:   You have blood in your throw up.  You have black or bloody poop (stool).  You have a bad headache or stiff neck.  You feel confused.  You have bad belly (abdominal) pain.  You have chest pain or trouble breathing.  You do not pee at least once every 8 hours.  You have cold, clammy skin.  You keep throwing up after 24 to 48 hours.  You have a fever. MAKE SURE YOU:   Understand these instructions.  Will watch your condition.  Will get help right away if you are not doing well or get worse.   This information is not intended to replace advice given to you by your health care provider. Make sure you discuss any questions you have with your health care provider.   Document Released: 11/10/2007 Document Revised: 08/16/2011 Document Reviewed: 10/23/2010 Elsevier Interactive Patient Education [employer]

## 2015-07-15 ENCOUNTER — Ambulatory Visit: Payer: Medicare Other | Admitting: Endocrinology

## 2015-09-29 ENCOUNTER — Ambulatory Visit: Payer: Medicare Other | Admitting: Endocrinology

## 2015-09-29 DIAGNOSIS — Z0289 Encounter for other administrative examinations: Secondary | ICD-10-CM

## 2015-09-30 ENCOUNTER — Telehealth: Payer: Self-pay | Admitting: Endocrinology

## 2015-09-30 NOTE — Telephone Encounter (Signed)
LVM for pt to call and schedule  °

## 2015-09-30 NOTE — Telephone Encounter (Signed)
Lucas [name], Could you contacted the pt and reschedule this appointment. Thanks!

## 2015-09-30 NOTE — Telephone Encounter (Signed)
Please come back for a follow-up appointment in 1 month.  

## 2015-09-30 NOTE — Telephone Encounter (Signed)
Patient no showed today's appt. Please advise on how to follow up. °A. No follow up necessary. °B. Follow up urgent. Contact patient immediately. °C. Follow up necessary. Contact patient and schedule visit in ___ days. °D. Follow up advised. Contact patient and schedule visit in ____weeks. ° °

## 2016-02-04 ENCOUNTER — Ambulatory Visit: Payer: Medicare Other | Admitting: Endocrinology

## 2016-02-20 ENCOUNTER — Other Ambulatory Visit: Payer: Self-pay | Admitting: Endocrinology

## 2016-02-20 NOTE — Telephone Encounter (Signed)
Please refill x 1 Ov is due  

## 2016-03-22 ENCOUNTER — Other Ambulatory Visit: Payer: Self-pay | Admitting: Endocrinology

## 2016-03-22 NOTE — Telephone Encounter (Signed)
Please refill x 1 Ov is due  

## 2016-03-23 ENCOUNTER — Telehealth: Payer: Self-pay | Admitting: Endocrinology

## 2016-03-23 NOTE — Telephone Encounter (Signed)
Patient's wife said the patient is having problems with the blood pressure medication he is taking now. It's causing his feet and legs to swell up, but the medication he was taking in September is not the same as the one he is taking now. Please advise.

## 2016-03-24 NOTE — Telephone Encounter (Signed)
See message and please advise, Thanks!  

## 2016-03-24 NOTE — Telephone Encounter (Signed)
Patient wife is calling on the status of message sent

## 2016-03-24 NOTE — Telephone Encounter (Signed)
Ov next available 

## 2016-03-25 ENCOUNTER — Telehealth: Payer: Self-pay

## 2016-03-25 ENCOUNTER — Ambulatory Visit (INDEPENDENT_AMBULATORY_CARE_PROVIDER_SITE_OTHER): Payer: Medicare Other | Admitting: Endocrinology

## 2016-03-25 VITALS — BP 110/80 | HR 101 | Wt 202.0 lb

## 2016-03-25 DIAGNOSIS — E119 Type 2 diabetes mellitus without complications: Secondary | ICD-10-CM | POA: Diagnosis not present

## 2016-03-25 DIAGNOSIS — I10 Essential (primary) hypertension: Secondary | ICD-10-CM

## 2016-03-25 LAB — CBC WITH DIFFERENTIAL/PLATELET
BASOS ABS: 0 10*3/uL (ref 0.0–0.1)
Basophils Relative: 0.7 % (ref 0.0–3.0)
EOS ABS: 0.1 10*3/uL (ref 0.0–0.7)
Eosinophils Relative: 1.2 % (ref 0.0–5.0)
HEMATOCRIT: 45.4 % (ref 39.0–52.0)
Hemoglobin: 15 g/dL (ref 13.0–17.0)
LYMPHS PCT: 30.4 % (ref 12.0–46.0)
Lymphs Abs: 2 10*3/uL (ref 0.7–4.0)
MCHC: 33 g/dL (ref 30.0–36.0)
MCV: 86.8 fl (ref 78.0–100.0)
Monocytes Absolute: 0.6 10*3/uL (ref 0.1–1.0)
Monocytes Relative: 9.9 % (ref 3.0–12.0)
NEUTROS ABS: 3.8 10*3/uL (ref 1.4–7.7)
Neutrophils Relative %: 57.8 % (ref 43.0–77.0)
PLATELETS: 175 10*3/uL (ref 150.0–400.0)
RBC: 5.23 Mil/uL (ref 4.22–5.81)
RDW: 17 % — ABNORMAL HIGH (ref 11.5–15.5)
WBC: 6.5 10*3/uL (ref 4.0–10.5)

## 2016-03-25 LAB — BASIC METABOLIC PANEL
BUN: 19 mg/dL (ref 6–23)
CALCIUM: 9.7 mg/dL (ref 8.4–10.5)
CO2: 36 meq/L — AB (ref 19–32)
CREATININE: 1.21 mg/dL (ref 0.40–1.50)
Chloride: 98 mEq/L (ref 96–112)
GFR: 74.1 mL/min (ref >60.00)
Glucose, Bld: 95 mg/dL (ref 70–99)
Potassium: 3.4 mEq/L — ABNORMAL LOW (ref 3.5–5.1)
Sodium: 143 mEq/L (ref 135–145)

## 2016-03-25 LAB — HEMOGLOBIN A1C: HEMOGLOBIN A1C: 5.8 % (ref 4.6–6.5)

## 2016-03-25 MED ORDER — FUROSEMIDE 40 MG PO TABS: 40.0000 mg | ORAL_TABLET | Freq: Every day | ORAL | 3 refills | Status: DC

## 2016-03-25 MED ORDER — POTASSIUM CHLORIDE CRYS ER 20 MEQ PO TBCR
20.0000 meq | EXTENDED_RELEASE_TABLET | Freq: Three times a day (TID) | ORAL | 11 refills | Status: DC
Start: 2016-03-25 — End: 2016-04-27

## 2016-03-25 MED ORDER — ALLOPURINOL 300 MG PO TABS: 300.0000 mg | ORAL_TABLET | Freq: Every day | ORAL | 6 refills | Status: DC

## 2016-03-25 MED ORDER — SIMVASTATIN 80 MG PO TABS: 80.0000 mg | ORAL_TABLET | Freq: Every day | ORAL | 3 refills | Status: DC

## 2016-03-25 NOTE — Telephone Encounter (Signed)
Called and left message advising to come in for the next available office visit per Dr.Ellison, gave call back number to schedule.

## 2016-03-25 NOTE — Patient Instructions (Addendum)
blood tests are requested for you today.  We'll let you know about the results. Please come back for a "medicare wellness" appointment in 4 months  

## 2016-03-25 NOTE — Progress Notes (Signed)
   Subjective:    Patient ID: Lucas [name], male    DOB: 03/28/1934, 82 y.o.   MRN: [id number]  HPI The state of at least three ongoing medical problems is addressed today, with interval history of each noted here: Edema: pt says unchanged.  He denies sob.  He says he takes his lasix, but wife says no.   HTN: denies chest pain.   Renal insufficiency: denies dysuria.   Past Medical History:  Diagnosis Date  . ALLERGIC RHINITIS 01/25/2007  . DM 07/24/2008  . 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 HYPERCHOLESTEROLEMIA 06/19/2008  . HYPERTENSION 06/19/2008  . HYPOKALEMIA 07/24/2008  . 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 TIA 06/26/2008    No past surgical history on file.  Social History   Social History  . Marital status: Married    Spouse name: N/A  . Number of children: N/A  . Years of education: N/A   Occupational History  . Not on file.   Social History Main Topics  . Smoking status: Never Smoker  . Smokeless tobacco: Not on file  . Alcohol use No  . Drug use: Unknown  . Sexual activity: Not on file   Other Topics Concern  . Not on file   Social History Narrative  . No narrative on file    Current Outpatient Prescriptions on File Prior to Visit  Medication Sig Dispense Refill  . aspirin 81 MG tablet Take 81 mg by mouth daily.      Marland Kitchen. lisinopril-hydrochlorothiazide (PRINZIDE,ZESTORETIC) 20-12.5 MG tablet TAKE 1 1/2 TABLETS BY MOUTH EVERY DAY 45 tablet 0   No current facility-administered medications on file prior to visit.     Allergies  Allergen Reactions  . Sulfonamide Derivatives Nausea And Vomiting    No family history on file.  BP 110/80 (BP Location: Right Arm, Patient Position: Sitting, Cuff Size: Normal)   Pulse (!) 101   Wt 202 lb (91.6 kg)   SpO2 96%   BMI 32.60 kg/m   Review of Systems Denies weight change and n/v.      Objective:   Physical Exam VITAL SIGNS:  See vs page.  GENERAL: no distress. LUNGS:   Clear to auscultation. Ext: 2+ bilat leg edema.   ecg is refused.   Flu shot is refused.   Lab Results  Component Value Date   CREATININE 1.21 03/25/2016   BUN 19 03/25/2016   NA 143 03/25/2016   K 3.4 (L) 03/25/2016   CL 98 03/25/2016   CO2 36 (H) 03/25/2016      Assessment & Plan:  Renal insufficiency: slightly improved.  We'll follow. Edema: stable: we can't increase lasix, due to contraction alkalosis. Same lasix. HTN: well-controlled.  Please continue the same medications.

## 2016-03-25 NOTE — Progress Notes (Signed)
Pre visit review using our clinic review tool, if applicable. No additional management support is needed unless otherwise documented below in the visit note. 

## 2016-04-12 ENCOUNTER — Telehealth: Payer: Self-pay | Admitting: Endocrinology

## 2016-04-12 DIAGNOSIS — I509 Heart failure, unspecified: Secondary | ICD-10-CM

## 2016-04-12 NOTE — Telephone Encounter (Signed)
Ok, I did referral 

## 2016-04-12 NOTE — Telephone Encounter (Signed)
Pt wife is asking what the name of the MD is Dr. Everardo AllEllison mentioned in the office visit for his swelling

## 2016-04-12 NOTE — Telephone Encounter (Signed)
I contacted the patient's wife and she stated to me to please place the referral for the heart doctor.

## 2016-04-12 NOTE — Telephone Encounter (Signed)
See message and please advise. I could not locate this in the referral tab under the patient's chart.

## 2016-04-12 NOTE — Addendum Note (Signed)
Addended by: Romero BellingELLISON, Ormand Senn on: 04/12/2016 04:54 PM   Modules accepted: Orders

## 2016-04-12 NOTE — Telephone Encounter (Signed)
I don't recall, but a heart doctor would help.  Do you want us to refer?

## 2016-04-27 ENCOUNTER — Ambulatory Visit (INDEPENDENT_AMBULATORY_CARE_PROVIDER_SITE_OTHER): Payer: Medicare Other | Admitting: Internal Medicine

## 2016-04-27 ENCOUNTER — Ambulatory Visit: Payer: Medicare Other | Admitting: Internal Medicine

## 2016-04-27 VITALS — BP 142/70 | HR 66 | Ht 66.0 in | Wt 198.6 lb

## 2016-04-27 DIAGNOSIS — I509 Heart failure, unspecified: Secondary | ICD-10-CM

## 2016-04-27 MED ORDER — SPIRONOLACTONE 25 MG PO TABS: 12.5000 mg | ORAL_TABLET | Freq: Every day | ORAL | 3 refills | Status: DC

## 2016-04-27 MED ORDER — LISINOPRIL 20 MG PO TABS: 20.0000 mg | ORAL_TABLET | Freq: Every day | ORAL | 6 refills | Status: DC

## 2016-04-27 MED ORDER — APIXABAN 5 MG PO TABS: 5.0000 mg | ORAL_TABLET | Freq: Two times a day (BID) | ORAL | 11 refills | Status: DC

## 2016-04-27 MED ORDER — CARVEDILOL 6.25 MG PO TABS: 6.2500 mg | ORAL_TABLET | Freq: Two times a day (BID) | ORAL | 3 refills | Status: DC

## 2016-04-27 MED ORDER — FUROSEMIDE 80 MG PO TABS: 80.0000 mg | ORAL_TABLET | Freq: Every day | ORAL | 3 refills | Status: DC

## 2016-04-27 NOTE — Patient Instructions (Addendum)
Medication Instructions: Your physician has recommended you make the following change in your medication:  1) Start lasix (furosemide) 80 mg- take one tablet by mouth once daily 2) Start lisinopril 20 mg- take one tablet by mouth once daily 3) Start coreg (carvedilol)- take one tablet by mouth twice daily 4) Start Eliquis 5 mg- take one tablet by mouth twice daily 5) Start aldactone (spironolactone) 25 mg- take 1/2 tablet (12.5 mg) by mouth once daily  Labwork: TOMORROW : BMET- come as early in the morning as you can (lab opens @ 7:30 am)  Procedures/Testing: Your physician has requested that you have an echocardiogram. Echocardiography is a painless test that uses sound waves to create images of your heart. It provides your doctor with information about the size and shape of your heart and how well your heart's chambers and valves are working. This procedure takes approximately one hour. There are no restrictions for this procedure.   Follow-Up: Your physician recommends that you schedule a follow-up appointment in:  2 WEEKS with any PA/NP   Any Additional Special Instructions Will Be Listed Below (If Applicable).     If you need a refill on your cardiac medications before your next appointment, please call your pharmacy.

## 2016-04-27 NOTE — Progress Notes (Signed)
ELECTROPHYSIOLOGY CONSULT NOTE  Patient ID: Lucas [name], MRN: [id number], DOB/AGE: 80/10/1933 80 y.o. Admit date: (Not on file) Date of Consult: 04/27/2016  Primary Physician: Romero BellingELLISON, [name], MD Primary Cardiologist: new Consulting Physician SE  Chief Complaint: heart failure   HPI Lucas [name] is a 82 y.o. male   Seen as an work in today because of symptoms of heart failure. He was referred from primary care.   he has no known heart history. He does have a history of hypertension. He has a history of long-standing irregular heartbeats. He is not better been told he has a heart attack. He has noted swelling in his feet for the last 4-6 weeks. He has not noted any change his exercise tolerance. He denies orthopnea nocturnal dyspnea and peripheral edema. He has not had significant palpitations.  Review of vital sign data demonstrates that his heart rate was noted to be 102 January 2017  Date      1/17    Cr  1.04      K   2.9       10/17    Cr  1.2      K   3.4     Hgb 15   He was seen remotely  Myoview images were 2008 were briefly reviewed today's ejection fraction was 55%.  Past Medical History:  Diagnosis Date  . ALLERGIC RHINITIS 01/25/2007  . DM 07/24/2008  . 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 HYPERCHOLESTEROLEMIA 06/19/2008  . HYPERTENSION 06/19/2008  . HYPOKALEMIA 07/24/2008  . 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 TIA 06/26/2008      Surgical History: No past surgical history on file.   Home Meds: Prior to Admission medications   Medication Sig Start Date End Date Taking? Authorizing Provider  allopurinol (ZYLOPRIM) 300 MG tablet Take 1 tablet (300 mg total) by mouth daily. 03/25/16   Romero BellingSean Ellison, MD  aspirin 81 MG tablet Take 81 mg by mouth daily.      Historical Provider, MD  furosemide (LASIX) 40 MG tablet Take 1 tablet (40 mg total) by mouth daily. 03/25/16   Romero BellingSean Ellison, MD  lisinopril-hydrochlorothiazide  (PRINZIDE,ZESTORETIC) 20-12.5 MG tablet TAKE 1 1/2 TABLETS BY MOUTH EVERY DAY 03/23/16   Romero BellingSean Ellison, MD  potassium chloride SA (K-DUR,KLOR-CON) 20 MEQ tablet Take 1 tablet (20 mEq total) by mouth 3 (three) times daily. 03/25/16   Romero BellingSean Ellison, MD  simvastatin (ZOCOR) 80 MG tablet Take 1 tablet (80 mg total) by mouth at bedtime. 03/25/16   Romero BellingSean Ellison, MD    Allergies:  Allergies  Allergen Reactions  . Sulfonamide Derivatives Nausea And Vomiting    Social History   Social History  . Marital status: Married    Spouse name: N/A  . Number of children: N/A  . Years of education: N/A   Occupational History  . Not on file.   Social History Main Topics  . Smoking status: Never Smoker  . Smokeless tobacco: Not on file  . Alcohol use No  . Drug use: Unknown  . Sexual activity: Not on file   Other Topics Concern  . Not on file   Social History Narrative  . No narrative on file     No family history on file.   ROS:  Please see the history of present illness.     All other systems reviewed and negative.    Physical Exam:  Blood pressure (!) 142/70, pulse 66, height 5'  6" (1.676 m), weight 198 lb 9.6 oz (90.1 kg), SpO2 98 %. General: Well developed, well nourished male in no acute distress. Head: Normocephalic, atraumatic, sclera non-icteric, no xanthomas, nares are without discharge. EENT: normal  Lymph Nodes:  none Neck: Negative for carotid bruits. JVD >10 Back:without scoliosis kyphosis  Lungs: Clear bilaterally to auscultation without wheezes, rales, or rhonchi. Breathing is unlabored. Heart:  Irregularly irregular rate and rhythm with a rapid rate 3/6 murmur which varies at the sternum suggesting an aortic murmur and does noT VARY AT The apex suggesting a mitral murmur  . No rubs, or gallops appreciated. Abdomen: Soft, non-tender, non-distended with normoactive bowel sounds. No hepatomegaly. No rebound/guarding. No obvious abdominal masses. +hjr right so Msk:  Strength  and tone appear normal for age. Extremities: No clubbing or cyanosis. 3+ edema.  Distal pedal pulses are 2+ and equal bilaterally. Skin: Warm and Dry Neuro: Alert and oriented X 3. CN III-XII intact Grossly normal sensory and motor function . Psych:  Responds to questions appropriately with a normal affect.      Labs: Cardiac Enzymes No results for input(s): CKTOTAL, CKMB, TROPONINI in the last 72 hours. CBC Lab Results  Component Value Date   WBC 6.5 03/25/2016   HGB 15.0 03/25/2016   HCT 45.4 03/25/2016   MCV 86.8 03/25/2016   PLT 175.0 03/25/2016   PROTIME: No results for input(s): LABPROT, INR in the last 72 hours. Chemistry No results for input(s): NA, K, CL, CO2, BUN, CREATININE, CALCIUM, PROT, BILITOT, ALKPHOS, ALT, AST, GLUCOSE in the last 168 hours.  Invalid input(s): LABALBU Lipids Lab Results  Component Value Date   CHOL 199 07/01/2015   HDL 43.10 07/01/2015   LDLCALC 136 (H) 07/01/2015   TRIG 99.0 07/01/2015   BNP No results found for: PROBNP Thyroid Function Tests: No results for input(s): TSH, T4TOTAL, T3FREE, THYROIDAB in the last 72 hours.  Invalid input(s): FREET3 Miscellaneous No results found for: DDIMER  Radiology/Studies:  No results found.  EKG:  Course atrial fibrillation with rapid ventricular response intermittent right bundle branch block aberration. Ventricular rate 136 Intervals-/10/36 Axis left -50  Assessment and Plan:   Acute on chronic heart failure  Atrial fibrillation/flutter with rapid ventricular response  Hypertension  History of hypokalemia   The patient presents with subacute heart failure. He has had no palpitations and I suspect his edema post dates the initiation of his atrial arrhythmia. I wonder whether there will be a rate-related cardiomyopathy further contributing. We will undertake an echo to look at LV function and LA size. Heart rates from vital signs of greater than 100 were noted almost a year ago.     We will need to work on slowing down his heart rate. We will begin with carvedilol 6.25 twice daily. We will continue him on his ACE inhibitor at 10 mg daily.  We will increase his diuretic to furosemide to 80 mg daily.  We will also put him on low-dose Aldactone to help with potassium sparing.      Sherryl MangesSteven Klein

## 2016-04-28 ENCOUNTER — Other Ambulatory Visit: Payer: Medicare Other | Admitting: *Deleted

## 2016-04-28 DIAGNOSIS — I509 Heart failure, unspecified: Secondary | ICD-10-CM | POA: Diagnosis not present

## 2016-04-28 LAB — BASIC METABOLIC PANEL
BUN: 19 mg/dL (ref 7–25)
CALCIUM: 9.4 mg/dL (ref 8.6–10.3)
CO2: 28 mmol/L (ref 20–31)
Chloride: 96 mmol/L — ABNORMAL LOW (ref 98–110)
Creat: 1.2 mg/dL — ABNORMAL HIGH (ref 0.70–1.11)
Glucose, Bld: 108 mg/dL — ABNORMAL HIGH (ref 65–99)
POTASSIUM: 2.9 mmol/L — AB (ref 3.5–5.3)
SODIUM: 144 mmol/L (ref 135–146)

## 2016-04-30 ENCOUNTER — Telehealth: Payer: Self-pay | Admitting: Cardiology

## 2016-04-30 NOTE — Telephone Encounter (Signed)
Pt's wife called. She says she was called and told to take "8 potasium pills" today and "4 pills Sat and Sunday and get lab re checked on Monday". The pt is nauseated now after 6 doses. I could find no record of any phone call to the pt about this but she says she was called today. The lab was done on 11/22- K+ was 2.9. Aldactone was added 11/21. That K+ dose seemed a little high to me and I told her to not give him anymore tonight-he has had 6 doses- and this weekend on give him two K+ doses a day and get his lab done Monday.  Corine ShelterLUKE Elisama Thissen PA-C 04/30/2016 6:49 PM

## 2016-05-03 ENCOUNTER — Other Ambulatory Visit: Payer: Self-pay | Admitting: *Deleted

## 2016-05-03 ENCOUNTER — Other Ambulatory Visit: Payer: Medicare Other | Admitting: *Deleted

## 2016-05-03 DIAGNOSIS — E876 Hypokalemia: Secondary | ICD-10-CM | POA: Diagnosis not present

## 2016-05-03 DIAGNOSIS — I10 Essential (primary) hypertension: Secondary | ICD-10-CM | POA: Diagnosis not present

## 2016-05-03 LAB — BASIC METABOLIC PANEL
BUN: 21 mg/dL (ref 7–25)
CALCIUM: 8.7 mg/dL (ref 8.6–10.3)
CHLORIDE: 95 mmol/L — AB (ref 98–110)
CO2: 32 mmol/L — AB (ref 20–31)
CREATININE: 1.27 mg/dL — AB (ref 0.70–1.11)
Glucose, Bld: 121 mg/dL — ABNORMAL HIGH (ref 65–99)
Potassium: 2.8 mmol/L — ABNORMAL LOW (ref 3.5–5.3)
Sodium: 138 mmol/L (ref 135–146)

## 2016-05-03 MED ORDER — POTASSIUM CHLORIDE CRYS ER 20 MEQ PO TBCR: 20.0000 meq | EXTENDED_RELEASE_TABLET | Freq: Two times a day (BID) | ORAL | 3 refills | Status: DC

## 2016-05-03 MED ORDER — SPIRONOLACTONE 25 MG PO TABS: 25.0000 mg | ORAL_TABLET | Freq: Every day | ORAL | 3 refills | Status: DC

## 2016-05-03 NOTE — Addendum Note (Signed)
Addended by: Tonita PhoenixBOWDEN, Rylan Bernard K on: 05/03/2016 09:02 AM   Modules accepted: Orders

## 2016-05-04 ENCOUNTER — Encounter: Payer: Self-pay | Admitting: Cardiology

## 2016-05-05 ENCOUNTER — Telehealth: Payer: Self-pay | Admitting: Internal Medicine

## 2016-05-05 NOTE — Telephone Encounter (Signed)
Pt's wife called, Nellie (no DPR). She states patient is having left leg swelling and can not tolerate potassium prescribed earlier this week. She states he is nauseated and constipated since starting the K.  I made appt for tomorrow with R. Keitha ButteUrsuy, PA-C at 0830.  I gave ED precautions and advised to keep appt tomorrow.

## 2016-05-05 NOTE — Progress Notes (Signed)
Cardiology Office Note Date:  05/06/2016  Patient ID:  Lucas [name], DOB 08/20/1933, MRN [id number] PCP:  Lucas BellingELLISON, [name], MD  Cardiologist:  Dr. Graciela HusbandsKlein (new 04/27/16)   Chief Complaint:  Medication concerns  History of Present Illness: Lucas [name] is a 82 y.o. male with history of HTN, HLD, TIA, PVD w/known carotid dz, was seen by Dr. Graciela HusbandsKlein 04/27/16 as a work-in new patient referred by the PMD for concerns of heart failure symptoms he was found in coarse AFib w/RVR and started on Coreg 6.25mg  BID, his furosemide increased to 80mg  daily, and started on Aldactone 25mg  daily given hx of hypokalemia.  (I do not see mention of instructions to take any extra or additional K+, AVS is reviewed with no instructions to take and extra K+)   04/28/16 K+ 2.9 03/25/16 K+ 3.4 07/02/15 K+ 3.7  Echo is ordered, pending  The patient comes accompanied by his wife, they state they were called and notified if his berng low and instructed to take that day 8 pills of his K+ and the next day to take 4, he states after taking the 6 of the 8 he says he became very nauseated and sick to his stomach.  No vomiting and could not take any more.  He remained very nauseous the following days, could not take the extra potassium as instructed giving him significant nausea the day prior.  By Monday he still was not feeling well and felt constipated. Hiw he did his K+ going from there is unclear, states he was able to tolerate daily dose but not so much at a time. But then also stated that he did not take any of his medicines since Monday, and believes he took 2 doses Monday.  He finally took MOM yesterday having a large BM feeling markedly better, and today feels "great".  Though with the nausea has not taken any of his pills since Monday until yesterday and this morning took his lisinopril only worried his BP would be too high.  He denies any kind of CP, no rest SOB, has had the same DOE for months.  He feels his heart  beat fast but remarks it has been like that for months as well.  He denies any dizziness, near syncope or syncope.  His edema is unchanged and also dates this back at least 2 months.  We went through his medicines, both the patient and his wife seemed quite confused about what they were for and why he was taking them  This was written for them.  He was only taking once daily his Eliquis and this was written for him as well.  Exactly what he had doTuesday or the rest of his medicines is unclear, though sounds like ultimately for sure since Tuesday has taken nothing except his lisinopril.     Past Medical History:  Diagnosis Date  . ALLERGIC RHINITIS 01/25/2007  . DM 07/24/2008  . 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 HYPERCHOLESTEROLEMIA 06/19/2008  . HYPERTENSION 06/19/2008  . HYPOKALEMIA 07/24/2008  . 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 TIA 06/26/2008    No past surgical history on file.  Current Outpatient Prescriptions  Medication Sig Dispense Refill  . allopurinol (ZYLOPRIM) 300 MG tablet Take 1 tablet (300 mg total) by mouth daily. 30 tablet 6  . apixaban (ELIQUIS) 5 MG TABS tablet Take 1 tablet (5 mg total) by mouth 2 (two) times daily. 60 tablet 11  . aspirin 81 MG tablet Take  81 mg by mouth daily.      . carvedilol (COREG) 6.25 MG tablet Take 1 tablet (6.25 mg total) by mouth 2 (two) times daily. 60 tablet 3  . furosemide (LASIX) 80 MG tablet Take 1 tablet (80 mg total) by mouth daily. 30 tablet 3  . lisinopril (PRINIVIL,ZESTRIL) 20 MG tablet Take 1 tablet (20 mg total) by mouth daily. 30 tablet 6  . potassium chloride SA (K-DUR,KLOR-CON) 20 MEQ tablet Take 1 tablet (20 mEq total) by mouth 2 (two) times daily. 60 tablet 3  . spironolactone (ALDACTONE) 25 MG tablet Take 1 tablet (25 mg total) by mouth daily. 30 tablet 3   No current facility-administered medications for this visit.     Allergies:   Sulfonamide derivatives   Social History:  The  patient  reports that he has never smoked. He has never used smokeless tobacco. He reports that he does not drink alcohol or use drugs.   Family History:  The patient's family history is not on file.  ROS:  Please see the history of present illness.    All other systems are reviewed and otherwise negative.   PHYSICAL EXAM:  VS:  Pulse (!) 149   Ht 5\' 6"  (1.676 m)   Wt 206 lb (93.4 kg)   BMI 33.25 kg/m  BMI: Body mass index is 33.25 kg/m. Well nourished, well developed, in no acute distress  HEENT: normocephalic, atraumatic  Neck: no JVD, carotid bruits or masses Cardiac:  Irregular and tachycardic; no significant murmurs, no rubs, or gallops Lungs:  clear to auscultation bilaterally, no wheezing, rhonchi or rales  Abd: soft, nontender MS: no deformity or atrophy Ext:  3+ edema to below the knee (equally) Skin: warm and dry, no rash Neuro:  No gross deficits appreciated Psych: euthymic mood, full affect   EKG:  Done today shows AFib, 149bpm   2008 Vascular evaluation Dr. Darrick PennaFields  04/2007 Carotid US >80% stenosis L ICA, <40% right Recommended CEA but patient declined  Recent Labs: 05/20/2015: TSH 2.17 07/02/2015: ALT 14 03/25/2016: Hemoglobin 15.0; Platelets 175.0 05/03/2016: BUN 21; Creat 1.27; Potassium 2.8; Sodium 138  07/01/2015: Cholesterol 199; HDL 43.10; LDL Cholesterol 136; Total CHOL/HDL Ratio 5; Triglycerides 99.0; VLDL 19.8   Estimated Creatinine Clearance: 49.6 mL/min (by C-G formula based on SCr of 1.27 mg/dL (H)).   Wt Readings from Last 3 Encounters:  05/06/16 206 lb (93.4 kg)  04/27/16 198 lb 9.6 oz (90.1 kg)  03/25/16 202 lb (91.6 kg)     Other studies reviewed: Additional studies/records reviewed today include: summarized above  ASSESSMENT AND PLAN:  1. PAFib     CHA2DS2Vasc is at least 3 on Eliquis     RVR again today, sounds like he has been for months     He has not taken coreg for what sounds like 3 days at least  2. CHF, fluid OL      Sounds like he may have taken only a day or 2 of the increased lasix, none since Sunday from what I can tell     He has no rest SOB, reports DOE that is unchanged from what he considers his baseline     He does not appear in any kind of distress, is not tachypnic at all     Weight is up   3. HTN     stable  4. Hypokalemia     Much confusion, seems he took Potassium 20meq BID on Monday, no other meds, and none since  then     He has already has a BMET drawn this morning  5. Known severe carotid disease from 2008     Pt then refused intervention, was recommended L CEA     No neuro symptoms  I discussed the case, reviewed EKG with Dr. Elberta Fortis.  Given non-toxic appearance and no overt symptomatology,  Resume meds, follow up on lab and manage K+ replacement once result is available I discussed daily weights and close monitoring for ongoing weight gain and if he develops increasing SOB, or symptoms of any kind to seek medical attension immediately      Disposition: F/u with next week with a visit and repeat BMET presumably will need further replacement  Current medicines are reviewed at length with the patient today.  The patient did not have any concerns regarding medicines.  Judith Blonder, PA-C 05/06/2016 2:33 PM     [facility] [address] [place] [address] [phone] (office)  [phone] (fax)

## 2016-05-05 NOTE — Telephone Encounter (Signed)
New message       Pt c/o swelling: STAT is pt has developed SOB within 24 hours  1. How long have you been experiencing swelling?   Worse since starting potassium pills 2. Where is the swelling located? Feet/legs  3.  Are you currently taking a "fluid pill"?  yes  4.  Are you currently SOB?  no 5.  Have you traveled recently?  no

## 2016-05-06 ENCOUNTER — Other Ambulatory Visit: Payer: Medicare Other | Admitting: *Deleted

## 2016-05-06 ENCOUNTER — Telehealth: Payer: Self-pay | Admitting: Physician Assistant

## 2016-05-06 ENCOUNTER — Ambulatory Visit (INDEPENDENT_AMBULATORY_CARE_PROVIDER_SITE_OTHER): Payer: Medicare Other | Admitting: Physician Assistant

## 2016-05-06 VITALS — HR 149 | Ht 66.0 in | Wt 206.0 lb

## 2016-05-06 DIAGNOSIS — I481 Persistent atrial fibrillation: Secondary | ICD-10-CM | POA: Diagnosis not present

## 2016-05-06 DIAGNOSIS — E876 Hypokalemia: Secondary | ICD-10-CM

## 2016-05-06 DIAGNOSIS — I5020 Unspecified systolic (congestive) heart failure: Secondary | ICD-10-CM

## 2016-05-06 DIAGNOSIS — I10 Essential (primary) hypertension: Secondary | ICD-10-CM

## 2016-05-06 DIAGNOSIS — I4819 Other persistent atrial fibrillation: Secondary | ICD-10-CM

## 2016-05-06 LAB — BASIC METABOLIC PANEL
BUN: 20 mg/dL (ref 7–25)
CALCIUM: 9.1 mg/dL (ref 8.6–10.3)
CO2: 31 mmol/L (ref 20–31)
CREATININE: 1.18 mg/dL — AB (ref 0.70–1.11)
Chloride: 95 mmol/L — ABNORMAL LOW (ref 98–110)
GLUCOSE: 117 mg/dL — AB (ref 65–99)
Potassium: 3.3 mmol/L — ABNORMAL LOW (ref 3.5–5.3)
SODIUM: 139 mmol/L (ref 135–146)

## 2016-05-06 NOTE — Telephone Encounter (Signed)
I re-called the home number and left a message for Mr. Wheeless to continue his medicines as written on the paperwork he left with today including his potassium and to call with questions.  I called the cell number and reached his wife (she confirmed the home number as correct) who was with him today and seemed to manage his medicines with the patient, reviewed with her that they should follow the instructions including his potassium as written today that the potassium level was improved though still a little low.  She stated understanding. Encouraged her to call if any questions or concerns.  Francis Dowseenee Audi Wettstein, PA-C

## 2016-05-06 NOTE — Patient Instructions (Signed)
Medication Instructions:   MAKE SURE TAKING  ELIQUIS 5 MG TWICE A DAY    RESUME ALL MEDS ON LIST EXEPT POTASSIUM  UNTIL A CALL BACK ABOUT LAB RESULTS  If you need a refill on your cardiac medications before your next appointment, please call your pharmacy.  Labwork: A BEMT IN ONE WEEK  05/12/16  BEFORE OFFICE VISIT.   Testing/Procedures: NONE ORDERED  TODAY    Follow-Up: KEEP APPT AS SCHEDULE ON 05/12/16    Any Other Special Instructions Will Be Listed Below (If Applicable).

## 2016-05-06 NOTE — Telephone Encounter (Signed)
Called patient to discuss lab result and potassium instructions.  Left a message to call back.  Francis Dowseenee Lonie Rummell, PA-C

## 2016-05-12 ENCOUNTER — Ambulatory Visit (INDEPENDENT_AMBULATORY_CARE_PROVIDER_SITE_OTHER): Payer: Medicare Other | Admitting: Cardiovascular Disease

## 2016-05-12 ENCOUNTER — Encounter (HOSPITAL_COMMUNITY): Payer: Self-pay | Admitting: General Practice

## 2016-05-12 ENCOUNTER — Encounter: Payer: Self-pay | Admitting: Cardiology

## 2016-05-12 ENCOUNTER — Inpatient Hospital Stay (HOSPITAL_COMMUNITY)
Admission: AD | Admit: 2016-05-12 | Discharge: 2016-05-15 | DRG: 292 | Disposition: A | Discharge status: Home / Self Care | Payer: Medicare Other | Source: Ambulatory Visit | Attending: Cardiovascular Disease | Admitting: Cardiovascular Disease

## 2016-05-12 VITALS — BP 102/70 | HR 66 | Ht 66.0 in | Wt 209.8 lb

## 2016-05-12 DIAGNOSIS — X58XXXA Exposure to other specified factors, initial encounter: Secondary | ICD-10-CM | POA: Diagnosis present

## 2016-05-12 DIAGNOSIS — S81802A Unspecified open wound, left lower leg, initial encounter: Secondary | ICD-10-CM | POA: Diagnosis present

## 2016-05-12 DIAGNOSIS — Z7901 Long term (current) use of anticoagulants: Secondary | ICD-10-CM | POA: Diagnosis not present

## 2016-05-12 DIAGNOSIS — Z8673 Personal history of transient ischemic attack (TIA), and cerebral infarction without residual deficits: Secondary | ICD-10-CM | POA: Diagnosis not present

## 2016-05-12 DIAGNOSIS — R0602 Shortness of breath: Secondary | ICD-10-CM | POA: Diagnosis not present

## 2016-05-12 DIAGNOSIS — I5043 Acute on chronic combined systolic (congestive) and diastolic (congestive) heart failure: Secondary | ICD-10-CM | POA: Insufficient documentation

## 2016-05-12 DIAGNOSIS — E1151 Type 2 diabetes mellitus with diabetic peripheral angiopathy without gangrene: Secondary | ICD-10-CM | POA: Diagnosis present

## 2016-05-12 DIAGNOSIS — I481 Persistent atrial fibrillation: Secondary | ICD-10-CM | POA: Diagnosis not present

## 2016-05-12 DIAGNOSIS — Z79899 Other long term (current) drug therapy: Secondary | ICD-10-CM | POA: Diagnosis not present

## 2016-05-12 DIAGNOSIS — E7800 Pure hypercholesterolemia, unspecified: Secondary | ICD-10-CM | POA: Diagnosis present

## 2016-05-12 DIAGNOSIS — I509 Heart failure, unspecified: Secondary | ICD-10-CM

## 2016-05-12 DIAGNOSIS — S91301A Unspecified open wound, right foot, initial encounter: Secondary | ICD-10-CM | POA: Diagnosis present

## 2016-05-12 DIAGNOSIS — I340 Nonrheumatic mitral (valve) insufficiency: Secondary | ICD-10-CM | POA: Diagnosis present

## 2016-05-12 DIAGNOSIS — Z882 Allergy status to sulfonamides status: Secondary | ICD-10-CM

## 2016-05-12 DIAGNOSIS — I4891 Unspecified atrial fibrillation: Secondary | ICD-10-CM | POA: Diagnosis not present

## 2016-05-12 DIAGNOSIS — I420 Dilated cardiomyopathy: Secondary | ICD-10-CM | POA: Diagnosis not present

## 2016-05-12 DIAGNOSIS — I959 Hypotension, unspecified: Secondary | ICD-10-CM | POA: Diagnosis not present

## 2016-05-12 DIAGNOSIS — I110 Hypertensive heart disease with heart failure: Principal | ICD-10-CM | POA: Diagnosis present

## 2016-05-12 DIAGNOSIS — I272 Pulmonary hypertension, unspecified: Secondary | ICD-10-CM | POA: Diagnosis present

## 2016-05-12 DIAGNOSIS — R0600 Dyspnea, unspecified: Secondary | ICD-10-CM | POA: Diagnosis not present

## 2016-05-12 DIAGNOSIS — I5023 Acute on chronic systolic (congestive) heart failure: Secondary | ICD-10-CM | POA: Diagnosis not present

## 2016-05-12 DIAGNOSIS — E876 Hypokalemia: Secondary | ICD-10-CM | POA: Diagnosis present

## 2016-05-12 DIAGNOSIS — I480 Paroxysmal atrial fibrillation: Secondary | ICD-10-CM | POA: Diagnosis not present

## 2016-05-12 HISTORY — DX: Unspecified atrial fibrillation: I48.91

## 2016-05-12 HISTORY — DX: Heart failure, unspecified: I50.9

## 2016-05-12 HISTORY — DX: Prediabetes: R73.03

## 2016-05-12 HISTORY — DX: Peripheral vascular disease, unspecified: I73.9

## 2016-05-12 HISTORY — DX: Gout, unspecified: M10.9

## 2016-05-12 HISTORY — DX: Low back pain, unspecified: M54.50

## 2016-05-12 HISTORY — DX: Cardiac arrhythmia, unspecified: I49.9

## 2016-05-12 HISTORY — DX: Unspecified osteoarthritis, unspecified site: M19.90

## 2016-05-12 HISTORY — DX: Other chronic pain: G89.29

## 2016-05-12 HISTORY — DX: Acute on chronic combined systolic (congestive) and diastolic (congestive) heart failure: I50.43

## 2016-05-12 HISTORY — DX: Low back pain: M54.5

## 2016-05-12 LAB — CBC WITH DIFFERENTIAL/PLATELET
BASOS PCT: 0 %
Basophils Absolute: 0 10*3/uL (ref 0.0–0.1)
EOS ABS: 0.1 10*3/uL (ref 0.0–0.7)
Eosinophils Relative: 1 %
HCT: 43.2 % (ref 39.0–52.0)
HEMOGLOBIN: 14.8 g/dL (ref 13.0–17.0)
LYMPHS ABS: 2.2 10*3/uL (ref 0.7–4.0)
Lymphocytes Relative: 34 %
MCH: 28.7 pg (ref 26.0–34.0)
MCHC: 34.3 g/dL (ref 30.0–36.0)
MCV: 83.9 fL (ref 78.0–100.0)
MONO ABS: 0.6 10*3/uL (ref 0.1–1.0)
MONOS PCT: 9 %
Neutro Abs: 3.6 10*3/uL (ref 1.7–7.7)
Neutrophils Relative %: 56 %
Platelets: 165 10*3/uL (ref 150–400)
RBC: 5.15 MIL/uL (ref 4.22–5.81)
RDW: 15.6 % — AB (ref 11.5–15.5)
WBC: 6.5 10*3/uL (ref 4.0–10.5)

## 2016-05-12 LAB — GLUCOSE, CAPILLARY: Glucose-Capillary: 110 mg/dL — ABNORMAL HIGH (ref 65–99)

## 2016-05-12 LAB — COMPREHENSIVE METABOLIC PANEL
ALBUMIN: 3.1 g/dL — AB (ref 3.5–5.0)
ALK PHOS: 125 U/L (ref 38–126)
ALT: 23 U/L (ref 17–63)
AST: 35 U/L (ref 15–41)
Anion gap: 10 (ref 5–15)
BUN: 17 mg/dL (ref 6–20)
CALCIUM: 9.4 mg/dL (ref 8.9–10.3)
CO2: 32 mmol/L (ref 22–32)
CREATININE: 1.31 mg/dL — AB (ref 0.61–1.24)
Chloride: 93 mmol/L — ABNORMAL LOW (ref 101–111)
GFR calc Af Amer: 58 mL/min — ABNORMAL LOW (ref >60)
GFR calc non Af Amer: 50 mL/min — ABNORMAL LOW (ref >60)
GLUCOSE: 99 mg/dL (ref 65–99)
Potassium: 3.2 mmol/L — ABNORMAL LOW (ref 3.5–5.1)
Sodium: 135 mmol/L (ref 135–145)
Total Bilirubin: 1.8 mg/dL — ABNORMAL HIGH (ref 0.3–1.2)
Total Protein: 6.1 g/dL — ABNORMAL LOW (ref 6.5–8.1)

## 2016-05-12 LAB — BRAIN NATRIURETIC PEPTIDE: B NATRIURETIC PEPTIDE 5: 660.7 pg/mL — AB (ref 0.0–100.0)

## 2016-05-12 LAB — TSH: TSH: 2.098 u[IU]/mL (ref 0.350–4.500)

## 2016-05-12 MED ORDER — DIGOXIN 0.25 MG/ML IJ SOLN
0.2500 mg | Freq: Once | INTRAMUSCULAR | Status: AC
  Administered 2016-05-12: 0.25 mg via INTRAVENOUS
  Filled 2016-05-12: qty 2

## 2016-05-12 MED ORDER — APIXABAN 5 MG PO TABS
5.0000 mg | ORAL_TABLET | Freq: Two times a day (BID) | ORAL | Status: DC
  Administered 2016-05-12 – 2016-05-15 (×5): 5 mg via ORAL
  Filled 2016-05-12 (×5): qty 1

## 2016-05-12 MED ORDER — SODIUM CHLORIDE 0.9 % IV SOLN: 250.0000 mL | INTRAVENOUS | Status: DC | PRN

## 2016-05-12 MED ORDER — POTASSIUM CHLORIDE CRYS ER 20 MEQ PO TBCR
20.0000 meq | EXTENDED_RELEASE_TABLET | Freq: Two times a day (BID) | ORAL | Status: DC
  Administered 2016-05-12 – 2016-05-14 (×4): 20 meq via ORAL
  Filled 2016-05-12 (×5): qty 1

## 2016-05-12 MED ORDER — ALLOPURINOL 300 MG PO TABS
300.0000 mg | ORAL_TABLET | Freq: Every day | ORAL | Status: DC
  Administered 2016-05-12 – 2016-05-15 (×4): 300 mg via ORAL
  Filled 2016-05-12 (×4): qty 1

## 2016-05-12 MED ORDER — ACETAMINOPHEN 325 MG PO TABS: 650.0000 mg | ORAL_TABLET | ORAL | Status: DC | PRN

## 2016-05-12 MED ORDER — SODIUM CHLORIDE 0.9% FLUSH
3.0000 mL | Freq: Two times a day (BID) | INTRAVENOUS | Status: DC
  Administered 2016-05-12 – 2016-05-14 (×3): 3 mL via INTRAVENOUS

## 2016-05-12 MED ORDER — CARVEDILOL 6.25 MG PO TABS
6.2500 mg | ORAL_TABLET | Freq: Two times a day (BID) | ORAL | Status: DC
  Administered 2016-05-12 – 2016-05-15 (×4): 6.25 mg via ORAL
  Filled 2016-05-12 (×5): qty 1

## 2016-05-12 MED ORDER — ASPIRIN EC 81 MG PO TBEC
81.0000 mg | DELAYED_RELEASE_TABLET | Freq: Every day | ORAL | Status: DC
  Administered 2016-05-12 – 2016-05-15 (×4): 81 mg via ORAL
  Filled 2016-05-12 (×4): qty 1

## 2016-05-12 MED ORDER — ONDANSETRON HCL 4 MG/2ML IJ SOLN
4.0000 mg | Freq: Four times a day (QID) | INTRAMUSCULAR | Status: DC | PRN
  Administered 2016-05-13: 4 mg via INTRAVENOUS
  Filled 2016-05-12: qty 2

## 2016-05-12 MED ORDER — FUROSEMIDE 10 MG/ML IJ SOLN
40.0000 mg | Freq: Two times a day (BID) | INTRAMUSCULAR | Status: DC
  Administered 2016-05-12 – 2016-05-13 (×2): 40 mg via INTRAVENOUS
  Filled 2016-05-12 (×2): qty 4

## 2016-05-12 MED ORDER — LISINOPRIL 20 MG PO TABS
20.0000 mg | ORAL_TABLET | Freq: Every day | ORAL | Status: DC
  Administered 2016-05-13: 20 mg via ORAL
  Filled 2016-05-12 (×2): qty 1

## 2016-05-12 MED ORDER — SODIUM CHLORIDE 0.9% FLUSH: 3.0000 mL | INTRAVENOUS | Status: DC | PRN

## 2016-05-12 NOTE — Progress Notes (Signed)
05/12/2016 Lucas [name]   07/18/1933  [id number]  Primary Physician Romero BellingELLISON, [name], MD Primary Cardiologist: Dr. Graciela HusbandsKlein    Reason for Visit/CC: Acute CHF, Atrial Fibrillation   HPI:  Lucas [name] is a 82 y.o. male with history of HTN, HLD, TIA, PVD w/known carotid dz, who was seen by Dr. Graciela HusbandsKlein 04/27/16 as a work-in new patient referred by his PCP for concerns of heart failure symptoms . He was found to be in atrial fibrillation with RVR with evidence of volume overload. He was started on Coreg 6.25mg  BID, his furosemide increased to 80mg  daily, and he was started on Aldactone 25mg  daily given hx of hypokalemia. He is on Eliquis for a/c. He was also instructed to get a 2D echo but this has not been completed.   He presents back to clinic today for follow-up. He is here with his wife and his daughter. He notes no improvement in symptoms since his last office visit. He notes worsening dyspnea on exertion, orthopnea, PND, 20 lb weight gain, lower extremity edema, abdominal distention, nausea and decreased appetite. He reports full medication compliance with Lasix and adherence to a low-sodium diet. He has noticed no significant change after increasing his diuretic from 40 mg to 80 mg. He is miserable. He gives out walking around from room to room in his house. He want's to feel better and is [facility].   Current Meds  Medication Sig  . allopurinol (ZYLOPRIM) 300 MG tablet Take 1 tablet (300 mg total) by mouth daily.  Marland Kitchen. apixaban (ELIQUIS) 5 MG TABS tablet Take 1 tablet (5 mg total) by mouth 2 (two) times daily.  . carvedilol (COREG) 6.25 MG tablet Take 1 tablet (6.25 mg total) by mouth 2 (two) times daily.  . furosemide (LASIX) 80 MG tablet Take 1 tablet (80 mg total) by mouth daily.  Marland Kitchen. lisinopril (PRINIVIL,ZESTRIL) 20 MG tablet Take 1 tablet (20 mg total) by mouth daily.  . potassium chloride SA (K-DUR,KLOR-CON) 20 MEQ tablet Take 1 tablet (20 mEq  total) by mouth 2 (two) times daily.  Marland Kitchen. spironolactone (ALDACTONE) 25 MG tablet Take 1 tablet (25 mg total) by mouth daily.   Allergies  Allergen Reactions  . Sulfonamide Derivatives Nausea And Vomiting   Past Medical History:  Diagnosis Date  . ALLERGIC RHINITIS 01/25/2007  . DM 07/24/2008  . 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 HYPERCHOLESTEROLEMIA 06/19/2008  . HYPERTENSION 06/19/2008  . HYPOKALEMIA 07/24/2008  . 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 TIA 06/26/2008   No family history on file. No past surgical history on file. Social History   Social History  . Marital status: Married    Spouse name: N/A  . Number of children: N/A  . Years of education: N/A   Occupational History  . Not on file.   Social History Main Topics  . Smoking status: Never Smoker  . Smokeless tobacco: Never Used  . Alcohol use No  . Drug use: No  . Sexual activity: Not on file   Other Topics Concern  . Not on file   Social History Narrative  . No narrative on file     Review of Systems: General: negative for chills, fever, night sweats or weight changes.  Cardiovascular: negative for chest pain, +dyspnea on exertion, +edema, +orthopnea, - palpitations, +paroxysmal nocturnal dyspnea +shortness of breath Dermatological: negative for rash Respiratory: negative for cough or wheezing Urologic: negative for hematuria Abdominal: negative for nausea,  vomiting, diarrhea, bright red blood per rectum, melena, or hematemesis Neurologic: negative for visual changes, syncope, or dizziness All other systems reviewed and are otherwise negative except as noted above.   Physical Exam:  Blood pressure 102/70, pulse 66, height 5\' 6"  (1.676 m), weight 209 lb 12.8 oz (95.2 kg), SpO2 97 %.  General appearance: alert, cooperative and no distress Neck: no carotid bruit and no JVD Lungs: decrased BS bilaterally Heart: irregularly irregular rhythm and tachy rate Abdomen:  distended, + BS Extremities: 2+ bilateral LE pitting + wheeping edema Pulses: 2+ and symmetric Skin: Skin color, texture, turgor normal. No rashes or lesions Neurologic: Grossly normal  EKG not performed   ASSESSMENT AND PLAN:    1. Acute CHF: EF unknown. He has evidence of massive volume overload with bilateral 2+ pitting and weeping LE edema. He also notes a 20 lb weight gain with progressive dyspnea, orthopnea and PND. He has failed escalating doses of his home diuretic. We will plan [facility] diure[facility]. He was on 80 mg PO daily. Will give 40 mg IV BID. Strict I/Os, daily weights, daily BMPs to monitor renal function and K and low sodium diet. We will order a 2D echo to assess LVF. Will ask wound care to help manage LE blisters/wounds to prevent infection.   2. Atrial Fibrillation: continue Coreg for rate control and Eliquis for a/c. Monitor rate on telemetry.   3. HTN: BP is soft in clinic today. Will monitor closely in the setting of IV diuresis. Continue Coreg for afib. We will hold his spironolactone for now. May need to hold his lisinopril if BP gets too low.    4. Known severe carotid disease from 2008: Pt then refused intervention, was recommended L CEA     No neuro symptoms. He is on Eliquis for stroke prophylaxis, in the setting of afib.    PLAN  the patient has also been seen and examined by [name], DOD,  who agrees with the plan outlined above. Pt has agreed to admission for further management of acute CHF and atrial fibrillation. [facility]. Bed request made for telemetry, with preference for 3E. They will notify patient when bed is available.   Robbie LisBrittainy Katelyn Broadnax PA-C 05/12/2016 10:38 AM   I have personally seen and examined this patient with Lucas MediciBrittany Boyd Litaker, PA-C. I agree with the assessment and plan as outlined above. He is volume overloaded and has failed attempts at diuresis at home with oral therapy. My exam shows a WDWN  male in NAD, QM:VHQIOCV:irreg irreg. Pulm: decreased BS bilaterally. Ext: 3+ bilateral LE edema. Ad[facility] for [facility] diuresis  Verne CarrowChristopher McAlhany 05/12/2016 1:41 PM

## 2016-05-12 NOTE — Patient Instructions (Signed)
We will call [facility]. When we call you, please go to admitting at the hospital to check in.

## 2016-05-12 NOTE — H&P (Signed)
05/12/2016 Lucas [name]   04/11/1934  [id number]  Primary Physician Romero BellingELLISON, [name], MD Primary Cardiologist: Dr. Graciela HusbandsKlein    Reason for Visit/CC: Acute CHF, Atrial Fibrillation   HPI:  Lucas [name] a 82 y.o.malewith history of HTN, HLD, TIA, PVD w/known carotid dz, who was seen by Dr. Graciela HusbandsKlein 04/27/16 as a work-in new patient referred by his PCP for concerns of heart failure symptoms . He was found to be in atrial fibrillation with RVR with evidence of volume overload. He was started on Coreg 6.25mg  BID, his furosemide increased to 80mg  daily, and he was started on Aldactone 25mg  daily given hx of hypokalemia. He is on Eliquis for a/c. He was also instructed to get a 2D echo but this has not been completed.   He presents back to clinic today for follow-up. He is here with his wife and his daughter. He notes no improvement in symptoms since his last office visit. He notes worsening dyspnea on exertion, orthopnea, PND, 20 lb weight gain, lower extremity edema, abdominal distention, nausea and decreased appetite. He reports full medication compliance with Lasix and adherence to a low-sodium diet. He has noticed no significant change after increasing his diuretic from 40 mg to 80 mg. He is miserable. He gives out walking around from room to room in his house. He want's to feel better and is [facility].   Active Medications  Current Meds  Medication Sig  . allopurinol (ZYLOPRIM) 300 MG tablet Take 1 tablet (300 mg total) by mouth daily.  Marland Kitchen. apixaban (ELIQUIS) 5 MG TABS tablet Take 1 tablet (5 mg total) by mouth 2 (two) times daily.  . carvedilol (COREG) 6.25 MG tablet Take 1 tablet (6.25 mg total) by mouth 2 (two) times daily.  . furosemide (LASIX) 80 MG tablet Take 1 tablet (80 mg total) by mouth daily.  Marland Kitchen. lisinopril (PRINIVIL,ZESTRIL) 20 MG tablet Take 1 tablet (20 mg total) by mouth daily.  . potassium chloride SA (K-DUR,KLOR-CON) 20 MEQ tablet  Take 1 tablet (20 mEq total) by mouth 2 (two) times daily.  Marland Kitchen. spironolactone (ALDACTONE) 25 MG tablet Take 1 tablet (25 mg total) by mouth daily.         Allergies  Allergen Reactions  . Sulfonamide Derivatives Nausea And Vomiting       Past Medical History:  Diagnosis Date  . ALLERGIC RHINITIS 01/25/2007  . DM 07/24/2008  . 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 HYPERCHOLESTEROLEMIA 06/19/2008  . HYPERTENSION 06/19/2008  . HYPOKALEMIA 07/24/2008  . 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 TIA 06/26/2008   No family history on file. No past surgical history on file. Social History        Social History  . Marital status: Married    Spouse name: N/A  . Number of children: N/A  . Years of education: N/A      Occupational History  . Not on file.       Social History Main Topics  . Smoking status: Never Smoker  . Smokeless tobacco: Never Used  . Alcohol use No  . Drug use: No  . Sexual activity: Not on file       Other Topics Concern  . Not on file      Social History Narrative  . No narrative on file     Review of Systems: General: negative for chills, fever, night sweats or weight changes.  Cardiovascular: negative for chest pain, +dyspnea on exertion, +  edema, +orthopnea, - palpitations, +paroxysmal nocturnal dyspnea +shortness of breath Dermatological: negative for rash Respiratory: negative for cough or wheezing Urologic: negative for hematuria Abdominal: negative for nausea, vomiting, diarrhea, bright red blood per rectum, melena, or hematemesis Neurologic: negative for visual changes, syncope, or dizziness All other systems reviewed and are otherwise negative except as noted above.   Physical Exam:  Blood pressure 102/70, pulse 66, height 5\' 6"  (1.676 m), weight 209 lb 12.8 oz (95.2 kg), SpO2 97 %.  General appearance: alert, cooperative and no distress Neck: no carotid bruit and no JVD Lungs: decrased BS  bilaterally Heart: irregularly irregular rhythm and tachy rate Abdomen: distended, + BS Extremities: 2+ bilateral LE pitting + wheeping edema Pulses: 2+ and symmetric Skin: Skin color, texture, turgor normal. No rashes or lesions Neurologic: Grossly normal  EKG not performed   ASSESSMENT AND PLAN:    1. Acute CHF: EF unknown. He has evidence of massive volume overload with bilateral 2+ pitting and weeping LE edema. He also notes a 20 lb weight gain with progressive dyspnea, orthopnea and PND. He has failed escalating doses of his home diuretic. [facility]. He was on 80 mg PO daily. Will give 40 mg IV BID. Strict I/Os, daily weights, daily BMPs to monitor renal function and K and low sodium diet. We will order a 2D echo to assess LVF. Will ask wound care to help manage LE blisters/wounds to prevent infection.   2. Atrial Fibrillation: continue Coreg for rate control and Eliquis for a/c. Monitor rate on telemetry.   3. HTN: BP is soft in clinic today. Will monitor closely in the setting of IV diuresis. Continue Coreg for afib. We will hold his spironolactone for now. May need to hold his lisinopril if BP gets too low.    4. Known severe carotid disease from 2008: Pt then refused intervention, was recommended L CEA No neuro symptoms. He is on Eliquis for stroke prophylaxis, in the setting of afib.    PLAN  the patient has also been seen and examined by [name], DOD,  who agrees with the plan outlined above. Pt has agreed to admission for further management of acute CHF and atrial fibrillation. [facility]. Bed request made for telemetry, with preference for 3E. They will notify patient when bed is available.   Robbie LisBrittainy Simmons PA-C 05/12/2016 10:38 AM   I have personally seen and examined this patient with [name], PA-C. I agree with the assessment and plan as outlined above. He is volume  overloaded and has failed attempts at diuresis at home with oral therapy. My exam shows a WDWN male in NAD, XB:JYNWGCV:irreg irreg. Pulm: decreased BS bilaterally. Ext: 3+ bilateral LE edema. Ad[facility] for [facility] diuresis  Verne CarrowChristopher Nusaiba Guallpa 05/12/2016 1:41 PM       Revision History

## 2016-05-13 ENCOUNTER — Inpatient Hospital Stay (HOSPITAL_COMMUNITY): Payer: Medicare Other

## 2016-05-13 DIAGNOSIS — I4891 Unspecified atrial fibrillation: Secondary | ICD-10-CM

## 2016-05-13 DIAGNOSIS — I481 Persistent atrial fibrillation: Secondary | ICD-10-CM

## 2016-05-13 DIAGNOSIS — I509 Heart failure, unspecified: Secondary | ICD-10-CM

## 2016-05-13 LAB — BASIC METABOLIC PANEL
ANION GAP: 11 (ref 5–15)
BUN: 17 mg/dL (ref 6–20)
CALCIUM: 9.1 mg/dL (ref 8.9–10.3)
CHLORIDE: 93 mmol/L — AB (ref 101–111)
CO2: 33 mmol/L — AB (ref 22–32)
Creatinine, Ser: 1.32 mg/dL — ABNORMAL HIGH (ref 0.61–1.24)
GFR calc Af Amer: 57 mL/min — ABNORMAL LOW (ref >60)
GFR calc non Af Amer: 49 mL/min — ABNORMAL LOW (ref >60)
GLUCOSE: 95 mg/dL (ref 65–99)
Potassium: 3.1 mmol/L — ABNORMAL LOW (ref 3.5–5.1)
Sodium: 137 mmol/L (ref 135–145)

## 2016-05-13 LAB — ECHOCARDIOGRAM COMPLETE
HEIGHTINCHES: 66 in
WEIGHTICAEL: 3220.8 [oz_av]

## 2016-05-13 MED ORDER — AMIODARONE HCL IN DEXTROSE 360-4.14 MG/200ML-% IV SOLN
60.0000 mg/h | INTRAVENOUS | Status: AC
  Administered 2016-05-13 (×2): 60 mg/h via INTRAVENOUS
  Filled 2016-05-13: qty 200

## 2016-05-13 MED ORDER — FUROSEMIDE 10 MG/ML IJ SOLN
80.0000 mg | Freq: Two times a day (BID) | INTRAMUSCULAR | Status: DC
  Administered 2016-05-14 – 2016-05-15 (×2): 80 mg via INTRAVENOUS
  Filled 2016-05-13 (×3): qty 8

## 2016-05-13 MED ORDER — POTASSIUM CHLORIDE CRYS ER 20 MEQ PO TBCR
40.0000 meq | EXTENDED_RELEASE_TABLET | Freq: Once | ORAL | Status: AC
  Administered 2016-05-13: 40 meq via ORAL
  Filled 2016-05-13: qty 2

## 2016-05-13 MED ORDER — POTASSIUM CHLORIDE CRYS ER 20 MEQ PO TBCR
40.0000 meq | EXTENDED_RELEASE_TABLET | Freq: Once | ORAL | Status: AC
  Administered 2016-05-13: 40 meq via ORAL

## 2016-05-13 MED ORDER — DILTIAZEM HCL 100 MG IV SOLR
5.0000 mg/h | Freq: Once | INTRAVENOUS | Status: DC
  Filled 2016-05-13: qty 100

## 2016-05-13 MED ORDER — METOLAZONE 2.5 MG PO TABS
2.5000 mg | ORAL_TABLET | Freq: Once | ORAL | Status: AC
  Administered 2016-05-13: 2.5 mg via ORAL
  Filled 2016-05-13: qty 1

## 2016-05-13 MED ORDER — DIGOXIN 0.25 MG/ML IJ SOLN
0.1250 mg | Freq: Once | INTRAMUSCULAR | Status: AC
  Administered 2016-05-13: 0.125 mg via INTRAVENOUS
  Filled 2016-05-13: qty 2

## 2016-05-13 MED ORDER — AMIODARONE LOAD VIA INFUSION
150.0000 mg | Freq: Once | INTRAVENOUS | Status: AC
  Administered 2016-05-13: 150 mg via INTRAVENOUS
  Filled 2016-05-13: qty 83.34

## 2016-05-13 MED ORDER — AMIODARONE IV BOLUS ONLY 150 MG/100ML
150.0000 mg | Freq: Once | INTRAVENOUS | Status: DC
  Filled 2016-05-13 (×2): qty 100

## 2016-05-13 MED ORDER — FUROSEMIDE 10 MG/ML IJ SOLN
40.0000 mg | Freq: Once | INTRAMUSCULAR | Status: AC
  Administered 2016-05-13: 40 mg via INTRAVENOUS
  Filled 2016-05-13: qty 4

## 2016-05-13 MED ORDER — AMIODARONE HCL IN DEXTROSE 360-4.14 MG/200ML-% IV SOLN
30.0000 mg/h | INTRAVENOUS | Status: DC
  Administered 2016-05-13 – 2016-05-15 (×5): 30 mg/h via INTRAVENOUS
  Filled 2016-05-13 (×5): qty 200

## 2016-05-13 MED ORDER — SODIUM CHLORIDE 0.9 % IV BOLUS (SEPSIS)
250.0000 mL | Freq: Once | INTRAVENOUS | Status: AC
  Administered 2016-05-13: 250 mL via INTRAVENOUS

## 2016-05-13 NOTE — Progress Notes (Signed)
Pt's heart rated up in the 174, 176 this am.  NP Su Hiltoberts on round this am.  Pt asymptomatic.  Will continue to monitor.  Amanda PeaNellie Waynesha Rammel , Charity fundraiserN.

## 2016-05-13 NOTE — Progress Notes (Signed)
On change of shift HR was 120.'s-160's, but BP was 90's systolic. Patient received his Coreg 6.25, but  With little to no change. Dr Virgina OrganQureshi  Was notified of the above values. Digoxin was ordered and given, but with minimal effect. @ 0224, HR remains @ low 100's to 140's, but occasionally 160's. And Dr Virgina OrganQureshi was notified via Text. Patient remains asymptomic and denies discomfort.

## 2016-05-13 NOTE — Progress Notes (Signed)
Not much change to HR. Dr Virgina OrganQureshi again notified. Order for Lasix 40 mg IV and Potassium 40 meq ( K level 3.1@ 0220) Patient received both meds @ 0445. Order also received for Digoxin 0.125 @ 0600

## 2016-05-13 NOTE — Evaluation (Signed)
Physical Therapy Evaluation and Discharge Patient Details Name: Lucas [name] MRN: [id number] DOB: 05/28/1934 Today's Date: 05/13/2016   History of Present Illness  Pt is an 82 y/o male admitted secondary to volume overload with acute exacerbation of CHF. PMH including but not limited to HTN, TIA (2010) and A-fib.  Clinical Impression  Pt presented sitting OOB in recliner when PT entered room. Prior to admission, pt reported that he was independent with all functional mobility and ADLs. Pt moving well during evaluation with supervision to min guard for safety only, no physical assistance needed. Pt with no instability or LOB noted during gait. No further acute PT needs identified at this time. PT signing off.     Follow Up Recommendations No PT follow up    Equipment Recommendations  None recommended by PT    Recommendations for Other Services       Precautions / Restrictions Precautions Precautions: None Restrictions Weight Bearing Restrictions: No      Mobility  Bed Mobility               General bed mobility comments: pt sitting OOB in recliner when PT entered room  Transfers Overall transfer level: Needs assistance Equipment used: None Transfers: Sit to/from Stand Sit to Stand: Supervision         General transfer comment: no instability noted, supervision for safety  Ambulation/Gait Ambulation/Gait assistance: Min guard Ambulation Distance (Feet): 75 Feet Assistive device: None Gait Pattern/deviations: Step-through pattern Gait velocity: WNL   General Gait Details: pt with no instability or LOB during gait, min guard for safety  Stairs            Wheelchair Mobility    Modified Rankin (Stroke Patients Only)       Balance Overall balance assessment: Needs assistance Sitting-balance support: Feet supported;No upper extremity supported Sitting balance-Leahy Scale: Good     Standing balance support: During functional activity;No upper  extremity supported Standing balance-Leahy Scale: Good                               Pertinent Vitals/Pain Pain Assessment: No/denies pain    Home Living Family/patient expects to be discharged to:: Private residence Living Arrangements: Spouse/significant other Available Help at Discharge: Family;Available 24 hours/day Type of Home: House Home Access: Level entry     Home Layout: One level Home Equipment: Walker - 2 wheels;Cane - single point;Wheelchair - manual (all equipment is his wife's; however, she does not use it)      Prior Function Level of Independence: Independent               Hand Dominance        Extremity/Trunk Assessment   Upper Extremity Assessment: Overall WFL for tasks assessed           Lower Extremity Assessment: Overall WFL for tasks assessed      Cervical / Trunk Assessment: Normal  Communication   Communication: No difficulties  Cognition Arousal/Alertness: Awake/alert Behavior During Therapy: WFL for tasks assessed/performed Overall Cognitive Status: Within Functional Limits for tasks assessed                      General Comments      Exercises     Assessment/Plan    PT Assessment Patent does not need any further PT services  PT Problem List            PT Treatment  Interventions      PT Goals (Current goals can be found in the Care Plan section)  Acute Rehab PT Goals Patient Stated Goal: return home    Frequency     Barriers to discharge        Co-evaluation               End of Session Equipment Utilized During Treatment: Gait belt Activity Tolerance: Patient tolerated treatment well Patient left: in chair;with call bell/phone within reach;with family/visitor present Nurse Communication: Mobility status         Time: 1435-1455 PT Time Calculation (min) (ACUTE ONLY): 20 min   Charges:   PT Evaluation $PT Eval Low Complexity: 1 Procedure     PT G CodesAlessandra Bevels:         Lorma Heater M Acheron Sugg 05/13/2016, 3:56 PM Deborah ChalkJennifer Anntoinette Haefele, PT, DPT [phone]

## 2016-05-13 NOTE — Consult Note (Addendum)
WOC Nurse wound consult note Reason for Consult: Consult requested for right leg.  Pt states he developed a blister this week to his inner ankle and it has been draining. Wound type: 2 previous blisters have ruptured and evolved into partial thickness wounds.  3X3.5 and 1X1.5cm with skin approximated over the site, mod amt yellow drainage leaking. Dressing procedure/placement/frequency: Foam dressing to protect and promote healing.  Discussed plan of care with patient and he verbalized understanding. Please re-consult if further assistance is needed.  Thank-you,  Cammie Mcgeeawn Margarie Mcguirt MSN, RN, CWOCN, LydenWCN-AP, CNS [phone]

## 2016-05-13 NOTE — Progress Notes (Signed)
  Echocardiogram 2D Echocardiogram has been performed.  Tai Skelly L Androw 05/13/2016, 10:38 AM

## 2016-05-13 NOTE — Progress Notes (Signed)
Patient Name: Lucas CootsBobby R [name] Date of Encounter: 05/13/2016  Primary Cardiologist: Dr. Beverly SessionsKlein  Hospital Problem List     Active Problems:   Acute on chronic heart failure [facility](HCC)   Atrial fibrillation (HCC)   Subjective   Sitting on the side of the bed, breathing is better today.   Inpatient Medications    Scheduled Meds: . allopurinol  300 mg Oral Daily  . apixaban  5 mg Oral BID  . aspirin EC  81 mg Oral Daily  . carvedilol  6.25 mg Oral BID  . diltiazem (CARDIZEM) infusion  5-15 mg/hr Intravenous Once  . furosemide  40 mg Intravenous BID  . lisinopril  20 mg Oral Daily  . potassium chloride SA  20 mEq Oral BID  . sodium chloride flush  3 mL Intravenous Q12H   . amiodarone    . amiodarone     Continuous Infusions:  PRN Meds: sodium chloride, acetaminophen, ondansetron (ZOFRAN) IV, sodium chloride flush   Vital Signs    Vitals:   05/13/16 0030 05/13/16 0515 05/13/16 0833 05/13/16 1053  BP: 97/64 105/65 123/80 114/84  Pulse: (!) 123 (!) 119 (!) 133 (!) 160  Resp: 20 18    Temp: 97.8 F (36.6 C)     TempSrc: Oral     SpO2: 100% 97%    Weight:  201 lb 4.8 oz (91.3 kg)    Height:        Intake/Output Summary (Last 24 hours) at 05/13/16 1106 Last data filed at 05/13/16 0858  Gross per 24 hour  Intake              360 ml  Output              725 ml  Net             -365 ml   Filed Weights   05/12/16 1904 05/13/16 0515  Weight: 203 lb 3.2 oz (92.2 kg) 201 lb 4.8 oz (91.3 kg)    Physical Exam    GEN: Well nourished, well developed, in no acute distress.  HEENT: Grossly normal.  Neck: Supple, no JVD, carotid bruits, or masses. Cardiac: Irreg Irreg tachy, no murmurs, rubs, or gallops. 2+ pitting edema bilaterally to lower extremities. Right foot with wound to heel. Radials/DP/PT 2+ and equal bilaterally.  Respiratory:  Respirations regular and unlabored, mild crackles bilaterally. GI: Soft, nontender, nondistended, BS + x 4. MS: no deformity or  atrophy. Skin: warm and dry, no rash. Neuro:  Strength and sensation are intact. Psych: AAOx3.  Normal affect.  Labs    CBC  Recent Labs  05/12/16 1909  WBC 6.5  NEUTROABS 3.6  HGB 14.8  HCT 43.2  MCV 83.9  PLT 165   Basic Metabolic Panel  Recent Labs  05/12/16 1909 05/13/16 0220  NA 135 137  K 3.2* 3.1*  CL 93* 93*  CO2 32 33*  GLUCOSE 99 95  BUN 17 17  CREATININE 1.31* 1.32*  CALCIUM 9.4 9.1   Liver Function Tests  Recent Labs  05/12/16 1909  AST 35  ALT 23  ALKPHOS 125  BILITOT 1.8*  PROT 6.1*  ALBUMIN 3.1*    Recent Labs  05/12/16 1909  TSH 2.098    Telemetry    AF RVR with rates into the 160s - Personally Reviewed  ECG    N/A - Personally Reviewed  Radiology    Dg Chest 2 View  Result Date: 05/13/2016 CLINICAL DATA:  Shortness of  breath. EXAM: CHEST  2 VIEW COMPARISON:  09/17/2013 FINDINGS: Cardiopericardial enlargement. There is generalized interstitial opacity with [employer]ley lines. Opacities are greater than previously seen. There is chronic/emphysematous changes at the lung bases on 07/02/2015 abdominal CT. No air bronchogram, effusion, or pneumothorax. Aortic arch atherosclerosis. IMPRESSION: 1. Interstitial opacities favoring mild edema. 2. Chronic cardiomegaly. 3. Background chronic lung disease Electronically Signed   By: Marnee SpringJonathon  Watts M.D.   On: 05/13/2016 08:37    Cardiac Studies   TTE: Pending  Patient Profile     82 y.o.malewith history of HTN, HLD, TIA, PVD w/known carotid dz, who was seen by Dr. Graciela HusbandsKlein 04/27/16 as a work-in new patient referred by his PCP for concerns of heart failure symptoms. He was seen back in the office on 12/6 and noted to have acute CHF.   Assessment & Plan    1. AF RVR: Rates have remained elevated overnight into the 160s, even with BB and dose of digoxin. Blood pressure has improved this morning. He is currently asymptomatic. Tells me his breathing is better.  -- Will attempt Amiodarone drip for  better rate control, but will need to closely monitor blood pressure. May need cardioversion if rates remain uncontrolled. States he has been compliant with his Eliquis. Discuss plan with MD.  -- on eliquis for anticoagulation.  2. Acute HF: EF unknown on admission. BNP 660. CXR with edema. Started on 40mg  IV lasix BID, weight is down 2lbs today. States his legs feel less tight.  -- Cr stable from yesterday, but is slightly above baseline.  -- Echo pending  3. HTN: Stable, treatment as above  4. Wound to right foot: Will ask wound RN to see this admission.   5. Hypokalemia: 3.1 this morning. Replaced, daily K+ given IV lasix.   Signed, [name], NP -C 05/13/2016, 11:06 AM   The patient was seen, examined and discussed with [name], NP-C and I agree with the above.   82 year old male with new dg of a-fib with RVR found on 11/21 when he was started on anticoagulation, and coreq, seen yesterday in the clinic with worsening CHF and persistent a-fib with RVR, he remains in a-fib with RVR overnight with ventricular rates 140-160 BPM. Also LLE wound sec to edema. He needs to be cardioverted, however there is no spot on the schedule, we will start iv amiodarone and increase lasix to 80 mg iv BID and add metolazone 2.5 x 1 today. LVEF is unknown, TTE is pending.    Lucas AlexanderKatarina Alleen Kehm, MD 05/13/2016

## 2016-05-13 NOTE — Progress Notes (Signed)
Pt's BP 84/72 and 77/50 with machine  And 188/52 manually.  Notified Candy SledgeE. Smith NP instructed to give pt Normal Saline bolus of 250ml x1.  Will continue to monitor.  Amanda PeaNellie Yanixan Mellinger, Charity fundraiserN.

## 2016-05-13 NOTE — Progress Notes (Signed)
Pt refused to have bed alarm and chair alarm turned on.  Pt states "It make a lot of noise and I don't need it and am not handicap."  Encourage pt to call for assistance as needed.  Pt verbalized understanding.  Amanda PeaNellie Ipek Westra, Charity fundraiserN.

## 2016-05-14 DIAGNOSIS — I4891 Unspecified atrial fibrillation: Secondary | ICD-10-CM

## 2016-05-14 DIAGNOSIS — I480 Paroxysmal atrial fibrillation: Secondary | ICD-10-CM

## 2016-05-14 DIAGNOSIS — I420 Dilated cardiomyopathy: Secondary | ICD-10-CM

## 2016-05-14 DIAGNOSIS — I5043 Acute on chronic combined systolic (congestive) and diastolic (congestive) heart failure: Secondary | ICD-10-CM

## 2016-05-14 LAB — BASIC METABOLIC PANEL
Anion gap: 11 (ref 5–15)
BUN: 16 mg/dL (ref 6–20)
CO2: 29 mmol/L (ref 22–32)
CREATININE: 1.41 mg/dL — AB (ref 0.61–1.24)
Calcium: 9.1 mg/dL (ref 8.9–10.3)
Chloride: 95 mmol/L — ABNORMAL LOW (ref 101–111)
GFR calc Af Amer: 53 mL/min — ABNORMAL LOW (ref >60)
GFR calc non Af Amer: 45 mL/min — ABNORMAL LOW (ref >60)
GLUCOSE: 115 mg/dL — AB (ref 65–99)
Potassium: 3.9 mmol/L (ref 3.5–5.1)
SODIUM: 135 mmol/L (ref 135–145)

## 2016-05-14 MED ORDER — LISINOPRIL 5 MG PO TABS
5.0000 mg | ORAL_TABLET | Freq: Every day | ORAL | Status: DC
  Filled 2016-05-14: qty 1

## 2016-05-14 MED ORDER — MAGNESIUM HYDROXIDE 400 MG/5ML PO SUSP
15.0000 mL | Freq: Every day | ORAL | Status: DC | PRN
  Administered 2016-05-14 – 2016-05-15 (×2): 15 mL via ORAL
  Filled 2016-05-14 (×2): qty 30

## 2016-05-14 NOTE — Progress Notes (Signed)
Patient Name: Marliss CootsBobby R Labat Date of Encounter: 05/14/2016  Primary Cardiologist: Dr. Beverly SessionsKlein  Hospital Problem List     Active Problems:   Acute on chronic heart failure [facility](HCC)   Atrial fibrillation (HCC)   Subjective   The patient is tired today, wants to go home, refuses to take his meds.   Inpatient Medications    Scheduled Meds: . allopurinol  300 mg Oral Daily  . apixaban  5 mg Oral BID  . aspirin EC  81 mg Oral Daily  . carvedilol  6.25 mg Oral BID  . furosemide  80 mg Intravenous BID  . lisinopril  20 mg Oral Daily  . potassium chloride SA  20 mEq Oral BID  . sodium chloride flush  3 mL Intravenous Q12H   . amiodarone 30 mg/hr (05/14/16 0758)   Continuous Infusions: . amiodarone 30 mg/hr (05/14/16 0758)   PRN Meds: sodium chloride, acetaminophen, magnesium hydroxide, ondansetron (ZOFRAN) IV, sodium chloride flush   Vital Signs    Vitals:   05/13/16 2356 05/14/16 0243 05/14/16 0513 05/14/16 0700  BP: 91/65 96/60 (!) 87/65 92/70  Pulse:  (!) 107 93 (!) 116  Resp: 20  18   Temp: 97.3 F (36.3 C)  98 F (36.7 C)   TempSrc: Oral  Oral   SpO2: 98%  97%   Weight:   203 lb 1.6 oz (92.1 kg)   Height:        Intake/Output Summary (Last 24 hours) at 05/14/16 0930 Last data filed at 05/14/16 0845  Gross per 24 hour  Intake           3728.4 ml  Output              575 ml  Net           3153.4 ml   Filed Weights   05/12/16 1904 05/13/16 0515 05/14/16 0513  Weight: 203 lb 3.2 oz (92.2 kg) 201 lb 4.8 oz (91.3 kg) 203 lb 1.6 oz (92.1 kg)    Physical Exam    GEN: Well nourished, well developed, in no acute distress.  HEENT: Grossly normal.  Neck: Supple, no JVD, carotid bruits, or masses. Cardiac: Irreg Irreg tachy, no murmurs, rubs, or gallops. 2+ pitting edema bilaterally to lower extremities. Right foot with wound to heel. Radials/DP/PT 2+ and equal bilaterally.  Respiratory:  Respirations regular and unlabored, mild crackles bilaterally. GI: Soft,  nontender, nondistended, BS + x 4. MS: no deformity or atrophy. Skin: warm and dry, no rash. Neuro:  Strength and sensation are intact. Psych: AAOx3.  Normal affect.  Labs    CBC  Recent Labs  05/12/16 1909  WBC 6.5  NEUTROABS 3.6  HGB 14.8  HCT 43.2  MCV 83.9  PLT 165   Basic Metabolic Panel  Recent Labs  05/13/16 0220 05/14/16 0206  NA 137 135  K 3.1* 3.9  CL 93* 95*  CO2 33* 29  GLUCOSE 95 115*  BUN 17 16  CREATININE 1.32* 1.41*  CALCIUM 9.1 9.1   Liver Function Tests  Recent Labs  05/12/16 1909  AST 35  ALT 23  ALKPHOS 125  BILITOT 1.8*  PROT 6.1*  ALBUMIN 3.1*    Recent Labs  05/12/16 1909  TSH 2.098    Telemetry    AF RVR with rates into the 160s - Personally Reviewed  ECG    N/A - Personally Reviewed  Radiology    Dg Chest 2 View  Result Date: 05/13/2016 CLINICAL  DATA:  Shortness of breath. EXAM: CHEST  2 VIEW COMPARISON:  09/17/2013 FINDINGS: Cardiopericardial enlargement. There is generalized interstitial opacity with [employer] Corporation[employer]ley lines. Opacities are greater than previously seen. There is chronic/emphysematous changes at the lung bases on 07/02/2015 abdominal CT. No air bronchogram, effusion, or pneumothorax. Aortic arch atherosclerosis. IMPRESSION: 1. Interstitial opacities favoring mild edema. 2. Chronic cardiomegaly. 3. Background chronic lung disease Electronically Signed   By: Marnee SpringJonathon  Watts M.D.   On: 05/13/2016 08:37    Cardiac Studies   TTE: 05/13/2016  - Left ventricle: The cavity size was moderately dilated. Systolic   function was mildly to moderately reduced. The estimated ejection   fraction was in the range of 40% to 45%. Wall motion was normal;   there were no regional wall motion abnormalities. The study was   not technically sufficient to allow evaluation of LV diastolic   dysfunction due to atrial fibrillation. Doppler parameters are   consistent with high ventricular filling pressure. - Aortic valve: Moderately  calcified annulus. Trileaflet; normal   thickness, moderately calcified leaflets. Moderate focal   calcification involving the noncoronary cusp. - Mitral valve: Calcified annulus. There was mild regurgitation. - Left atrium: The atrium was moderately dilated. - Pulmonary arteries: PA peak pressure: 47 mm Hg (S).  Impressions:  - The right ventricular systolic pressure was increased consistent   with moderate pulmonary hypertension    Patient Profile     82 y.o.malewith history of HTN, HLD, TIA, PVD w/known carotid dz, who was seen by Dr. Graciela HusbandsKlein 04/27/16 as a work-in new patient referred by his PCP for concerns of heart failure symptoms. He was seen back in the office on 12/6 and noted to have acute CHF.   Assessment & Plan    1. AF RVR: Rates have improved from 160s to low 100', the patient insists on going home, if he decides to go, we will switch to PO amiodarone 400 mg po BID and schedule an outpatient cardioversion.  Blood pressure has improved this morning. He is currently asymptomatic. Tells me his breathing is better.  -- Will attempt Amiodarone drip for better rate control, but will need to closely monitor blood pressure. May need cardioversion if rates remain uncontrolled. States he has been compliant with his Eliquis. Discuss plan with MD.  -- on eliquis for anticoagulation.  2. Acute combined systolic and diastolic CHF: EF unknown on admission. BNP 660. CXR with edema. Started on 80mg  IV lasix BID, minimal diuresis so far, we will educate him about fluid restriction.Marland Kitchen.  -- Cr stable from yesterday, but is slightly above baseline.  -- Echo showed LVEF 40-45%  3. HTN: hypotensive now, we will decrease lisinopril to 5 mg po daily.   4. Wound to right foot: Will ask wound RN to see this admission.   5. Hypokalemia: 3.1 this morning. Replaced, daily K+ given IV lasix.    Tobias AlexanderKatarina Clora Ohmer, MD 05/14/2016

## 2016-05-14 NOTE — Progress Notes (Signed)
Refuses bed alarm.Will continue to monitor.

## 2016-05-14 NOTE — Progress Notes (Signed)
    Went to talk with patient as he was refusing to take his daily medications and wanting to go home. I had a long discussion with him about the need for each of his medications. Also talked with his wife and daughter on the phone, who want him to stay. They will be coming to see him this afternoon to talk with him. RN updated on plan of care.

## 2016-05-14 NOTE — Care Management Note (Signed)
Case Management Note  Patient Details  Name: Lucas [name] MRN: [id number] Date of Birth: 10/04/1933  Subjective/Objective:   Admitted with CHF                 Action/Plan: Patient lives at home; PCP is  Dr Romero BellingELLISON, [name]; has [facility] with [facility] drug coverage; CM following for DCP.  Expected Discharge Date:     Possibly 05/18/2016             Expected Discharge Plan:  Home/Self Care  [facility]-House Ref[facility]   [facility]  Discharge planning Services  CM Consult   Status of Service:  In process, will continue to follow  Reola MosherChandler, Rubin Dais L, RN,MHA,BSN [phone] 05/14/2016, 3:34 PM

## 2016-05-15 ENCOUNTER — Other Ambulatory Visit: Payer: Self-pay | Admitting: Physician Assistant

## 2016-05-15 ENCOUNTER — Encounter (HOSPITAL_COMMUNITY): Payer: Self-pay | Admitting: Physician Assistant

## 2016-05-15 DIAGNOSIS — I5022 Chronic systolic (congestive) heart failure: Secondary | ICD-10-CM

## 2016-05-15 DIAGNOSIS — I5023 Acute on chronic systolic (congestive) heart failure: Secondary | ICD-10-CM

## 2016-05-15 LAB — BASIC METABOLIC PANEL
Anion gap: 10 (ref 5–15)
BUN: 21 mg/dL — ABNORMAL HIGH (ref 6–20)
CALCIUM: 9.2 mg/dL (ref 8.9–10.3)
CO2: 31 mmol/L (ref 22–32)
CREATININE: 1.66 mg/dL — AB (ref 0.61–1.24)
Chloride: 91 mmol/L — ABNORMAL LOW (ref 101–111)
GFR calc non Af Amer: 37 mL/min — ABNORMAL LOW (ref >60)
GFR, EST AFRICAN AMERICAN: 43 mL/min — AB (ref >60)
GLUCOSE: 106 mg/dL — AB (ref 65–99)
Potassium: 5.7 mmol/L — ABNORMAL HIGH (ref 3.5–5.1)
Sodium: 132 mmol/L — ABNORMAL LOW (ref 135–145)

## 2016-05-15 LAB — POTASSIUM: Potassium: 4.9 mmol/L (ref 3.5–5.1)

## 2016-05-15 MED ORDER — FUROSEMIDE 80 MG PO TABS: 80.0000 mg | ORAL_TABLET | Freq: Two times a day (BID) | ORAL | 3 refills | Status: DC

## 2016-05-15 MED ORDER — AMIODARONE HCL 200 MG PO TABS: 400.0000 mg | ORAL_TABLET | Freq: Two times a day (BID) | ORAL | 3 refills | Status: DC

## 2016-05-15 NOTE — Progress Notes (Signed)
Discharge instructions printed and given to patient, and reviewed with him, his wife, his son, and daughter in law. IV d/c'd, and tele monitor removed. Escorted to discharge via wheelchair in no apparent distress. All questions and concerns addressed prior to discharge.

## 2016-05-15 NOTE — Discharge Instructions (Signed)
Weigh daily  Limit all fluids to between 1 and 1-1/2 quarts (liters) daily. (32-50oz) 2000 mg sodium daily.  Elevate legs daily whenever possible.  See med list for changes.  **The office will call early next week for labs and follow up visit.

## 2016-05-15 NOTE — Progress Notes (Signed)
Patient Name: Lucas CootsBobby R [name] Date of Encounter: 05/15/2016  Primary Cardiologist: Dr. Beverly SessionsKlein  Hospital Problem List     Active Problems:   Acute on chronic heart failure [facility](HCC)   Atrial fibrillation (HCC)   Subjective   Yesterday the patient was refusing his medications, wanting to go home. Lucas PageLindsay Roberts, NP talked to the daughter and patient on the phone in the afternoon and tried to convince him to stay.  Unfortunately, he is adamant once again about leaving. He states that he is not short of breath and that his lower extremity is feeling better.  Inpatient Medications    Scheduled Meds: . allopurinol  300 mg Oral Daily  . apixaban  5 mg Oral BID  . aspirin EC  81 mg Oral Daily  . carvedilol  6.25 mg Oral BID  . furosemide  80 mg Intravenous BID  . lisinopril  5 mg Oral Daily  . potassium chloride SA  20 mEq Oral BID  . sodium chloride flush  3 mL Intravenous Q12H   . amiodarone 30 mg/hr (05/15/16 0840)   Continuous Infusions: . amiodarone 30 mg/hr (05/15/16 0840)   PRN Meds: sodium chloride, acetaminophen, magnesium hydroxide, ondansetron (ZOFRAN) IV, sodium chloride flush   Vital Signs    Vitals:   05/14/16 1741 05/14/16 2009 05/14/16 2346 05/15/16 0422  BP: 96/62 (!) 86/57 100/78 (!) 85/63  Pulse: (!) 113 89 98 (!) 112  Resp:  16 18 20   Temp:  97.7 F (36.5 C)  98 F (36.7 C)  TempSrc:  Oral  Oral  SpO2:  99% 98% 99%  Weight:    203 lb 9.6 oz (92.4 kg)  Height:        Intake/Output Summary (Last 24 hours) at 05/15/16 1156 Last data filed at 05/15/16 0423  Gross per 24 hour  Intake           776.56 ml  Output             1075 ml  Net          -298.44 ml   Filed Weights   05/13/16 0515 05/14/16 0513 05/15/16 0422  Weight: 201 lb 4.8 oz (91.3 kg) 203 lb 1.6 oz (92.1 kg) 203 lb 9.6 oz (92.4 kg)    Physical Exam    GEN: Well nourished, well developed, in no acute distress.  HEENT: Grossly normal.  Neck: Supple, no JVD, carotid bruits, or  masses. Cardiac: Irreg Irreg tachy, no murmurs, rubs, or gallops. 2+ pitting edema bilaterally to lower extremities. Right foot with wound to heel. Radials/DP/PT 2+ and equal bilaterally.  Respiratory:  Respirations regular and unlabored, mild crackles bilaterally. GI: Soft, nontender, nondistended, BS + x 4. MS: no deformity or atrophy. Skin: warm and dry, no rash. Neuro:  Strength and sensation are intact. Psych: AAOx3.  Normal affect.  Labs    CBC  Recent Labs  05/12/16 1909  WBC 6.5  NEUTROABS 3.6  HGB 14.8  HCT 43.2  MCV 83.9  PLT 165   Basic Metabolic Panel  Recent Labs  05/14/16 0206 05/15/16 0422  NA 135 132*  K 3.9 5.7*  CL 95* 91*  CO2 29 31  GLUCOSE 115* 106*  BUN 16 21*  CREATININE 1.41* 1.66*  CALCIUM 9.1 9.2   Liver Function Tests  Recent Labs  05/12/16 1909  AST 35  ALT 23  ALKPHOS 125  BILITOT 1.8*  PROT 6.1*  ALBUMIN 3.1*    Recent Labs  05/12/16 1909  TSH 2.098    Telemetry    AF RVR with rates into the 100-160s - Personally Reviewed  ECG    N/A - Personally Reviewed  Radiology    No results found.  Cardiac Studies   TTE: 05/13/2016  - Left ventricle: The cavity size was moderately dilated. Systolic   function was mildly to moderately reduced. The estimated ejection   fraction was in the range of 40% to 45%. Wall motion was normal;   there were no regional wall motion abnormalities. The study was   not technically sufficient to allow evaluation of LV diastolic   dysfunction due to atrial fibrillation. Doppler parameters are   consistent with high ventricular filling pressure. - Aortic valve: Moderately calcified annulus. Trileaflet; normal   thickness, moderately calcified leaflets. Moderate focal   calcification involving the noncoronary cusp. - Mitral valve: Calcified annulus. There was mild regurgitation. - Left atrium: The atrium was moderately dilated. - Pulmonary arteries: PA peak pressure: 47 mm Hg  (S).  Impressions:  - The right ventricular systolic pressure was increased consistent   with moderate pulmonary hypertension    Patient Profile     82 y.o.malewith history of HTN, HLD, TIA, PVD w/known carotid dz, who was seen by Dr. Graciela HusbandsKlein 04/27/16 as a work-in new patient referred by his PCP for concerns of heart failure symptoms. He was seen back in the office on 12/6 and noted to have acute CHF.   Assessment & Plan    1. AF RVR persistent: Rates have improved from 160s to low 100's,  --  Amiodarone drip for better rate control. States he has been compliant with his Eliquis.  -- on eliquis for anticoagulation.   2. Acute combined systolic and diastolic CHF: BNP 660. CXR with edema. Started on 80mg  IV lasix BID,  fluid restriction.  -- Echo showed LVEF 40-45% -- Net +2-1/2 L ? -- Creatinine increased from 1.32-1.41->> 1.66 -- Given hypotension and further need for diuresis I will stop his lisinopril  3. HTN: hypotensive, we will stop lisinopril to 5 mg po daily.   4. Wound to right foot:  Dressing in place   5. Hypokalemia: From 3.1 to 5.7 to 4.9  this morning. Hold potassium supplementation, stop ACE inhibitor. On IV lasix.   He is adamant about leaving. We will convert his IV amiodarone to by mouth amiodarone 400 mg twice a day for the next 5 days then 200 mg twice a day for 7 days, then 200 mg a day thereafter.  Continue Lasix 80 mg by mouth twice a day.  Have him follow-up on Monday in clinic with basic metabolic profile.  Stop potassium supplementation at home given elevated potassium.  No spironolactone.  No lisinopril.  Continue carvedilol 6.25 mg twice a day.  Encourage fluid restriction 1-1-1/2 L a day Low-salt diet Elevated lower extremities to help with significant lower extremity edema  Once again, he does not stay any longer, unwilling to stay.  We will have close follow-up in clinic.   Donato SchultzMark Jaelen Gellerman, MD 05/15/2016

## 2016-05-15 NOTE — Discharge Summary (Signed)
Discharge Summary    Patient ID: Marliss CootsBobby R Weyland,  MRN: [id number], DOB/AGE: 80/10/1933 80 y.o.  Admit date: 05/12/2016 Discharge date: 05/15/2016  Primary Care Provider: Romero BellingELLISON, [name] Primary Cardiologist: Dr Graciela HusbandsKlein  Discharge Diagnoses    Principal Problem:   Acute on chronic systolic and diastolic heart failure, NYHA class 3 (HCC) Active Problems:   Atrial fibrillation (HCC)   Allergies Allergies  Allergen Reactions  . Sulfonamide Derivatives Nausea And Vomiting    Diagnostic Studies/Procedures    ECHO: 05/13/2016 - Left ventricle: The cavity size was moderately dilated. Systolic   function was mildly to moderately reduced. The estimated ejection   fraction was in the range of 40% to 45%. Wall motion was normal;   there were no regional wall motion abnormalities. The study was   not technically sufficient to allow evaluation of LV diastolic   dysfunction due to atrial fibrillation. Doppler parameters are   consistent with high ventricular filling pressure. - Aortic valve: Moderately calcified annulus. Trileaflet; normal   thickness, moderately calcified leaflets. Moderate focal   calcification involving the noncoronary cusp. - Mitral valve: Calcified annulus. There was mild regurgitation. - Left atrium: The atrium was moderately dilated. - Pulmonary arteries: PA peak pressure: 47 mm Hg (S). Impressions: - The right ventricular systolic pressure was increased consistent   with moderate pulmonary hypertension. _____________   History of Present Illness     Mr.Armstrongis a 82 y.o.malewith history of HTN, HLD, TIA, PVD w/known carotid dz, who was seen by Dr. Graciela HusbandsKlein 04/27/16 as a work-in new patient referred by hisPCPfor concerns of heart failure symptoms . He was found to be in atrial fibrillation with RVR with evidence of volume overload. He was started on Coreg 6.25mg  BID, his furosemide increased to 80mg  daily, and he was started on Aldactone 25mg  daily  given hx of hypokalemia. He is on Eliquis for a/c.  Hospital Course     Consultants: none   Mr Hilda Bladesrmstrong was given IV Lasix and initial diuresis was slow. The dose was increased to 80 mg IV BID and his breathing improved. His I/O are inaccurate, but symptomatically he improved and his O2 sats improved.   He was started on IV Amiodarone for rate control. He was on Eliquis pta and this was continued. He had no bleeding issues. He had some problems with hypotension and hyperkalemia, so the lisinopril, spironolactone and potassium supplement were stopped.   He was refusing meds at one point and was not compliant with I/O tracking. He was insisting on leaving. HIs BP was running low at times, but he did not want to stay in the hospital for further management. His BP is high enough to continue the Coreg and Lasix.  On 12/09, he was seen by Dr Anne FuSkains. As the patient was refusing some meds, he was converted to po amiodarone and po Lasix. He is in improved condition and is to be discharged home, to follow up next week in the office.   _____________  Discharge Vitals Blood pressure (!) 85/63, pulse (!) 112, temperature 98 F (36.7 C), temperature source Oral, resp. rate 20, height 5\' 6"  (1.676 m), weight 203 lb 9.6 oz (92.4 kg), SpO2 99 %.  Filed Weights   05/13/16 0515 05/14/16 0513 05/15/16 0422  Weight: 201 lb 4.8 oz (91.3 kg) 203 lb 1.6 oz (92.1 kg) 203 lb 9.6 oz (92.4 kg)    Labs & Radiologic Studies    CBC  Recent Labs  05/12/16 1909  WBC  6.5  NEUTROABS 3.6  HGB 14.8  HCT 43.2  MCV 83.9  PLT 165   Basic Metabolic Panel  Recent Labs  05/14/16 0206 05/15/16 0422 05/15/16 1016  NA 135 132*  --   K 3.9 5.7* 4.9  CL 95* 91*  --   CO2 29 31  --   GLUCOSE 115* 106*  --   BUN 16 21*  --   CREATININE 1.41* 1.66*  --   CALCIUM 9.1 9.2  --    Liver Function Tests  Recent Labs  05/12/16 1909  AST 35  ALT 23  ALKPHOS 125  BILITOT 1.8*  PROT 6.1*  ALBUMIN 3.1*     Recent Labs  05/12/16 1909  TSH 2.098   _____________  Dg Chest 2 View  Result Date: 05/13/2016 CLINICAL DATA:  Shortness of breath. EXAM: CHEST  2 VIEW COMPARISON:  09/17/2013 FINDINGS: Cardiopericardial enlargement. There is generalized interstitial opacity with [employer] Corporation[employer]ley lines. Opacities are greater than previously seen. There is chronic/emphysematous changes at the lung bases on 07/02/2015 abdominal CT. No air bronchogram, effusion, or pneumothorax. Aortic arch atherosclerosis. IMPRESSION: 1. Interstitial opacities favoring mild edema. 2. Chronic cardiomegaly. 3. Background chronic lung disease Electronically Signed   By: Marnee SpringJonathon  Watts M.D.   On: 05/13/2016 08:37   Disposition   Pt is being discharged home today in good condition.  Follow-up Plans & Appointments     Discharge Instructions    (HEART FAILURE PATIENTS) Call MD:  Anytime you have any of the following symptoms: 1) 3 pound weight gain in 24 hours or 5 pounds in 1 week 2) shortness of breath, with or without a dry hacking cough 3) swelling in the hands, feet or stomach 4) if you have to sleep on extra pillows at night in order to breathe.    Complete by:  As directed    Diet - low sodium heart healthy    Complete by:  As directed    Increase activity slowly    Complete by:  As directed       Discharge Medications   Current Discharge Medication List    START taking these medications   Details  amiodarone (PACERONE) 200 MG tablet Take 2 tablets (400 mg total) by mouth 2 (two) times daily. 2 tabs bid x 5 days, then 1 tab bid x 7 day, then 1 tab daily Qty: 45 tablet, Refills: 3      CONTINUE these medications which have CHANGED   Details  furosemide (LASIX) 80 MG tablet Take 1 tablet (80 mg total) by mouth 2 (two) times daily. Qty: 60 tablet, Refills: 3      CONTINUE these medications which have NOT CHANGED   Details  acetaminophen (TYLENOL) 325 MG tablet Take 325 mg by mouth every 6 (six) hours as needed.     allopurinol (ZYLOPRIM) 300 MG tablet Take 1 tablet (300 mg total) by mouth daily. Qty: 30 tablet, Refills: 6    apixaban (ELIQUIS) 5 MG TABS tablet Take 1 tablet (5 mg total) by mouth 2 (two) times daily. Qty: 60 tablet, Refills: 11    carvedilol (COREG) 6.25 MG tablet Take 1 tablet (6.25 mg total) by mouth 2 (two) times daily. Qty: 60 tablet, Refills: 3    magnesium hydroxide (MILK OF MAGNESIA) 400 MG/5ML suspension Take 30 mLs by mouth daily as needed for mild constipation or indigestion.      STOP taking these medications     lisinopril (PRINIVIL,ZESTRIL) 20 MG tablet  potassium chloride SA (K-DUR,KLOR-CON) 20 MEQ tablet      spironolactone (ALDACTONE) 25 MG tablet      aspirin 81 MG tablet           Outstanding Labs/Studies   None  Duration of Discharge Encounter   Greater than 30 minutes including physician time.  Melida Quitter NP 05/15/2016, 2:29 PM  Primary Cardiologist: Dr. Beverly Sessions Problem List     Active Problems:   Acute on chronic heart failure [facility])   Atrial fibrillation (HCC)   Subjective   Yesterday the patient was refusing his medications, wanting to go home. Laverda Page, NP talked to the daughter and patient on the phone in the afternoon and tried to convince him to stay.  Unfortunately, he is adamant once again about leaving. He states that he is not short of breath and that his lower extremity is feeling better.  Inpatient Medications    Scheduled Meds: . allopurinol  300 mg Oral Daily  . apixaban  5 mg Oral BID  . aspirin EC  81 mg Oral Daily  . carvedilol  6.25 mg Oral BID  . furosemide  80 mg Intravenous BID  . lisinopril  5 mg Oral Daily  . potassium chloride SA  20 mEq Oral BID  . sodium chloride flush  3 mL Intravenous Q12H   . amiodarone 30 mg/hr (05/15/16 0840)   Continuous Infusions: . amiodarone 30 mg/hr (05/15/16 0840)   PRN Meds: sodium chloride, acetaminophen, magnesium hydroxide,  ondansetron (ZOFRAN) IV, sodium chloride flush   Vital Signs          Vitals:   05/14/16 1741 05/14/16 2009 05/14/16 2346 05/15/16 0422  BP: 96/62 (!) 86/57 100/78 (!) 85/63  Pulse: (!) 113 89 98 (!) 112  Resp:  16 18 20   Temp:  97.7 F (36.5 C)  98 F (36.7 C)  TempSrc:  Oral  Oral  SpO2:  99% 98% 99%  Weight:    203 lb 9.6 oz (92.4 kg)  Height:        Intake/Output Summary (Last 24 hours) at 05/15/16 1156 Last data filed at 05/15/16 0423  Gross per 24 hour  Intake           776.56 ml  Output             1075 ml  Net          -298.44 ml        Filed Weights   05/13/16 0515 05/14/16 0513 05/15/16 0422  Weight: 201 lb 4.8 oz (91.3 kg) 203 lb 1.6 oz (92.1 kg) 203 lb 9.6 oz (92.4 kg)    Physical Exam    GEN: Well nourished, well developed, in no acute distress.  HEENT: Grossly normal.  Neck: Supple, no JVD, carotid bruits, or masses. Cardiac: Irreg Irreg tachy, no murmurs, rubs, or gallops. 2+ pitting edema bilaterally to lower extremities. Right foot with wound to heel. Radials/DP/PT 2+ and equal bilaterally.  Respiratory:  Respirations regular and unlabored, mild crackles bilaterally. GI: Soft, nontender, nondistended, BS + x 4. MS: no deformity or atrophy. Skin: warm and dry, no rash. Neuro:  Strength and sensation are intact. Psych: AAOx3.  Normal affect.  Labs    CBC  Recent Labs (last 2 labs)    Recent Labs  05/12/16 1909  WBC 6.5  NEUTROABS 3.6  HGB 14.8  HCT 43.2  MCV 83.9  PLT 165     Basic Metabolic Panel  Recent Labs (  last 2 labs)    Recent Labs  05/14/16 0206 05/15/16 0422  NA 135 132*  K 3.9 5.7*  CL 95* 91*  CO2 29 31  GLUCOSE 115* 106*  BUN 16 21*  CREATININE 1.41* 1.66*  CALCIUM 9.1 9.2     Liver Function Tests  Recent Labs (last 2 labs)    Recent Labs  05/12/16 1909  AST 35  ALT 23  ALKPHOS 125  BILITOT 1.8*  PROT 6.1*  ALBUMIN 3.1*      Recent Labs (last 2 labs)    Recent  Labs  05/12/16 1909  TSH 2.098      Telemetry    AF RVR with rates into the 100-160s - Personally Reviewed  ECG    N/A - Personally Reviewed  Radiology    Imaging Results (Last 48 hours)  No results found.    Cardiac Studies   TTE: 05/13/2016  - Left ventricle: The cavity size was moderately dilated. Systolic function was mildly to moderately reduced. The estimated ejection fraction was in the range of 40% to 45%. Wall motion was normal; there were no regional wall motion abnormalities. The study was not technically sufficient to allow evaluation of LV diastolic dysfunction due to atrial fibrillation. Doppler parameters are consistent with high ventricular filling pressure. - Aortic valve: Moderately calcified annulus. Trileaflet; normal thickness, moderately calcified leaflets. Moderate focal calcification involving the noncoronary cusp. - Mitral valve: Calcified annulus. There was mild regurgitation. - Left atrium: The atrium was moderately dilated. - Pulmonary arteries: PA peak pressure: 47 mm Hg (S).  Impressions:  - The right ventricular systolic pressure was increased consistent with moderate pulmonary hypertension    Patient Profile     82 y.o.malewith history of HTN, HLD, TIA, PVD w/known carotid dz, who was seen by Dr. Graciela Husbands 04/27/16 as a work-in new patient referred by hisPCPfor concerns of heart failure symptoms. He was seen back in the office on 12/6 and noted to have acute CHF.   Assessment & Plan    1. AF RVR persistent: Rates have improved from 160s to low 100's,  --  Amiodarone drip for better rate control. States he has been compliant with his Eliquis.  -- on eliquis for anticoagulation.   2. Acute combined systolic and diastolic CHF: BNP 660. CXR with edema. Started on 80mg  IV lasix BID,  fluid restriction.  -- Echo showed LVEF 40-45% -- Net +2-1/2 L ? -- Creatinine increased from 1.32-1.41->> 1.66 --  Given hypotension and further need for diuresis I will stop his lisinopril  3. HTN: hypotensive, we will stop lisinopril to 5 mg po daily.   4. Wound to right foot:  Dressing in place   5. Hypokalemia: From 3.1 to 5.7 to 4.9  this morning. Hold potassium supplementation, stop ACE inhibitor. On IV lasix.   He is adamant about leaving. We will convert his IV amiodarone to by mouth amiodarone 400 mg twice a day for the next 5 days then 200 mg twice a day for 7 days, then 200 mg a day thereafter.  Continue Lasix 80 mg by mouth twice a day.  Have him follow-up on Monday in clinic with basic metabolic profile.  Stop potassium supplementation at home given elevated potassium.  No spironolactone.  No lisinopril.  Continue carvedilol 6.25 mg twice a day.  Encourage fluid restriction 1-1-1/2 L a day Low-salt diet Elevated lower extremities to help with significant lower extremity edema  Once again, he does not stay any longer, unwilling  to stay.  We will have close follow-up in clinic.   Donato Schultz, MD 05/15/2016

## 2016-05-17 ENCOUNTER — Telehealth: Payer: Self-pay | Admitting: *Deleted

## 2016-05-17 NOTE — Telephone Encounter (Signed)
[facility]    Patient understands to follow up with provider   -Yes. Dr. Graciela HusbandsKlein on 05/18/16 at  2:15 at [address]    Patient understands discharge instructions?  -Yes  Patient understands medications and regiment?  -yes  Patient understands to bring all medications to this visit?  Yes

## 2016-05-17 NOTE — Telephone Encounter (Signed)
-----   Message from Darrol Jumphonda G Barrett, PA-C sent at 05/15/2016  2:16 PM EST ----- Pt of Dr Graciela HusbandsKlein He needs BMET and TCM/TOC office visit early next week. For CHF.  Thanks W.W. Grainger Inchonda

## 2016-05-18 ENCOUNTER — Encounter: Payer: Self-pay | Admitting: *Deleted

## 2016-05-18 ENCOUNTER — Ambulatory Visit (INDEPENDENT_AMBULATORY_CARE_PROVIDER_SITE_OTHER): Payer: Medicare Other | Admitting: Internal Medicine

## 2016-05-18 ENCOUNTER — Encounter: Payer: Self-pay | Admitting: Internal Medicine

## 2016-05-18 ENCOUNTER — Other Ambulatory Visit: Payer: Medicare Other | Admitting: *Deleted

## 2016-05-18 VITALS — BP 112/60 | HR 109 | Ht 66.0 in | Wt 196.8 lb

## 2016-05-18 DIAGNOSIS — I4819 Other persistent atrial fibrillation: Secondary | ICD-10-CM

## 2016-05-18 DIAGNOSIS — I5022 Chronic systolic (congestive) heart failure: Secondary | ICD-10-CM | POA: Diagnosis not present

## 2016-05-18 DIAGNOSIS — E876 Hypokalemia: Secondary | ICD-10-CM | POA: Diagnosis not present

## 2016-05-18 DIAGNOSIS — I5020 Unspecified systolic (congestive) heart failure: Secondary | ICD-10-CM | POA: Diagnosis not present

## 2016-05-18 DIAGNOSIS — I481 Persistent atrial fibrillation: Secondary | ICD-10-CM | POA: Diagnosis not present

## 2016-05-18 NOTE — Patient Instructions (Signed)
Medication Instructions: - Your physician recommends that you continue on your current medications as directed. Please refer to the Current Medication list given to you today.  Labwork: - none ordered  Procedures/Testing: - Your physician has recommended that you have a Cardioversion (DCCV). Electrical Cardioversion uses a jolt of electricity to your heart either through paddles or wired patches attached to your chest. This is a controlled, usually prescheduled, procedure. Defibrillation is done under light anesthesia in the hospital, and you usually go home the day of the procedure. This is done to get your heart back into a normal rhythm. You are not awake for the procedure. Please see the instruction sheet given to you today.  Follow-Up: - Your physician recommends that you schedule a follow-up appointment in: 3-4 weeks (from 05/27/16) with Francis Dowseenee Ursuy, PA  Any Additional Special Instructions Will Be Listed Below (If Applicable).     If you need a refill on your cardiac medications before your next appointment, please call your pharmacy.

## 2016-05-18 NOTE — Progress Notes (Signed)
Patient Care Team: Romero BellingSean Ellison, MD as PCP - General   HPI  [name] is a 82 y.o. male Seen in follow-up for acute on chronic heart failure. He was found to be in atrial fibrillation and was started on amiodarone carvedilol ELIQUIS and diuretics. He was found to be profoundly hypokalemic and Aldactone was increased   have her return for outpatient follow-up he was found to be significantly short of breath still and was admitted 12/17. He was discharged prior to being ready AGAINST MEDICAL ADVICE, noncompliant with medications. Discharge heart rate was 112 and blood pressure is 85. Echocardiogram 12/17 45%   he is better since discharge. He says his edema is less his dyspnea is less.  Date      12/17    Cr  1.3      K   3.2     1./17    Cr  1 66      K   4.9        Records and Results Reviewed Ho he seated in the eventspitial records  Past Medical History:  Diagnosis Date  . Acute CHF (congestive heart failure) (HCC) dx'd 04/2016  . Acute on chronic systolic and diastolic heart failure, NYHA class 3 (HCC) 05/12/2016  . ALLERGIC RHINITIS 01/25/2007  . Arthritis    "back, legs, shoulders, arms, qwhere" (05/12/2016)  . Atrial fibrillation with RVR (HCC)    [name]/notes 05/12/2016  . Chronic lower back pain   . DM 07/24/2008  . 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 Gout   . HYPERCHOLESTEROLEMIA 06/19/2008  . HYPERTENSION 06/19/2008  . HYPOKALEMIA 07/24/2008  . Irregular heart beat   . 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 Pre-diabetes    "borderline" (05/12/2016)  . PVD (peripheral vascular disease) (HCC)    [name]/notes 05/12/2016  . TIA 06/26/2008    Past Surgical History:  Procedure Laterality Date  . BACK SURGERY    . CATARACT EXTRACTION W/ INTRAOCULAR LENS IMPLANT Left   . INGUINAL HERNIA REPAIR Left   . KNEE ARTHROSCOPY Left   . LUMBAR DISC SURGERY  1980s   "got hurt on the job"  . TONSILLECTOMY      Current Outpatient Prescriptions    Medication Sig Dispense Refill  . acetaminophen (TYLENOL) 325 MG tablet Take 325 mg by mouth every 6 (six) hours as needed.    Marland Kitchen. allopurinol (ZYLOPRIM) 300 MG tablet Take 1 tablet (300 mg total) by mouth daily. 30 tablet 6  . amiodarone (PACERONE) 200 MG tablet Take 2 tablets (400 mg total) by mouth 2 (two) times daily. 2 tabs bid x 5 days, then 1 tab bid x 7 day, then 1 tab daily 45 tablet 3  . apixaban (ELIQUIS) 5 MG TABS tablet Take 1 tablet (5 mg total) by mouth 2 (two) times daily. 60 tablet 11  . carvedilol (COREG) 6.25 MG tablet Take 1 tablet (6.25 mg total) by mouth 2 (two) times daily. 60 tablet 3  . furosemide (LASIX) 80 MG tablet Take 1 tablet (80 mg total) by mouth 2 (two) times daily. 60 tablet 3  . magnesium hydroxide (MILK OF MAGNESIA) 400 MG/5ML suspension Take 30 mLs by mouth daily as needed for mild constipation or indigestion.     No current facility-administered medications for this visit.     Allergies  Allergen Reactions  . Sulfonamide Derivatives Nausea And Vomiting      Review of Systems negative except from HPI  and PMH  Physical Exam BP 112/60   Pulse (!) 109   Ht 5\' 6"  (1.676 m)   Wt 196 lb 12.8 oz (89.3 kg)   SpO2 96%   BMI 31.76 kg/m  Well developed and well nourished in no acute distress HENT normal E scleral and icterus clear Neck Supple JVP 8-10; carotids brisk and full Clear to ausculation  Regular rate and rhythm, no murmurs gallops or rub Soft with active bowel sounds No clubbing cyanosis 2+ Edema  there is no weeping lesion on his l Aleright leg. We dressed it here. rt and oriented, grossly normal motor and sensory function Skin Warm and  weeping    ECG demonstrates atrial fibrillation at 96 with occasional PVCs. Intervals-/10/33 Axis left -54    Assessment and  Plan  Atrial fibrillation-persistent    acute on chronic HFpEF  hypokalemia   Sore right leg  The patient's heart failure is improved with intervals 10 pound weight  loss. He still has significant edema. We will continue his diuresis. We will re-assess his electrolytes today.  He says that he has been taking his ELIQUIS twice daily since it was initiated 11/21. We will continue to loaded with amiodarone over the next week and anticipate cardioversion next week.  He is instructed to intensive lesion on his leg with topical antibiotics. He is to call Dr. Everardo AllEllison if there problems with it.       Current medicines are reviewed at length with the patient today .  The patient does not  have concerns regarding medicines.

## 2016-05-19 LAB — BASIC METABOLIC PANEL
BUN: 30 mg/dL — ABNORMAL HIGH (ref 7–25)
CO2: 33 mmol/L — AB (ref 20–31)
Calcium: 8.7 mg/dL (ref 8.6–10.3)
Chloride: 91 mmol/L — ABNORMAL LOW (ref 98–110)
Creat: 1.74 mg/dL — ABNORMAL HIGH (ref 0.70–1.11)
GLUCOSE: 141 mg/dL — AB (ref 65–99)
Potassium: 3.4 mmol/L — ABNORMAL LOW (ref 3.5–5.3)
SODIUM: 136 mmol/L (ref 135–146)

## 2016-05-20 ENCOUNTER — Other Ambulatory Visit: Payer: Self-pay | Admitting: Internal Medicine

## 2016-05-20 DIAGNOSIS — I4819 Other persistent atrial fibrillation: Secondary | ICD-10-CM

## 2016-05-24 ENCOUNTER — Other Ambulatory Visit (HOSPITAL_COMMUNITY): Payer: Medicare Other

## 2016-05-25 ENCOUNTER — Other Ambulatory Visit: Payer: Self-pay | Admitting: *Deleted

## 2016-05-25 NOTE — Patient Outreach (Signed)
[facility] Care Management  05/25/2016  [name] 05/11/1934 [id number]  Referral from EMMI-HF dashboard-red on new/worsening problems:  Telephone call to contact number & alternate number; messages left requesting call back.  Plan: Will follow up.  Colleen CanLinda Zyanya Glaza, RN BSN CCM Care Management Coordinator [facility] Care Management  [phone]

## 2016-05-26 ENCOUNTER — Other Ambulatory Visit: Payer: Self-pay | Admitting: *Deleted

## 2016-05-26 ENCOUNTER — Ambulatory Visit: Payer: Medicare Other | Admitting: Nurse Practitioner

## 2016-05-26 NOTE — Patient Outreach (Signed)
[facility] Care Management  05/26/2016  [name] 01/05/1934 [id number]  EMMI-Heart Failure -dashboard referral "-new/worsening problem 12/18":  Telephone call to patient; spouse answered call & advised that patient was not available.  Advised spouse to have patient return my call. Spouse states that patient will have heart procedure tomorrow ^& to call back in AM. States patient is weighing daily and weight is coming down each day. States he had appointment with cardiologist last week.  Plan: Will follow up.  Colleen CanLinda Foy Vanduyne, RN BSN CCM Care Management Coordinator [facility] Care [facility]  [phone]

## 2016-05-27 ENCOUNTER — Other Ambulatory Visit: Payer: Self-pay | Admitting: *Deleted

## 2016-05-27 ENCOUNTER — Ambulatory Visit (HOSPITAL_COMMUNITY)
Admission: RE | Admit: 2016-05-27 | Discharge: 2016-05-27 | Disposition: A | Discharge status: Home / Self Care | Payer: Medicare Other | Source: Ambulatory Visit | Attending: Internal Medicine | Admitting: Internal Medicine

## 2016-05-27 ENCOUNTER — Encounter (HOSPITAL_COMMUNITY)
Admission: RE | Disposition: A | Discharge status: Home / Self Care | Payer: Self-pay | Source: Ambulatory Visit | Attending: Internal Medicine

## 2016-05-27 ENCOUNTER — Encounter (HOSPITAL_COMMUNITY): Payer: Self-pay | Admitting: Certified Registered Nurse Anesthetist

## 2016-05-27 ENCOUNTER — Encounter: Payer: Self-pay | Admitting: *Deleted

## 2016-05-27 DIAGNOSIS — Z538 Procedure and treatment not carried out for other reasons: Secondary | ICD-10-CM | POA: Insufficient documentation

## 2016-05-27 DIAGNOSIS — I4891 Unspecified atrial fibrillation: Secondary | ICD-10-CM | POA: Insufficient documentation

## 2016-05-27 DIAGNOSIS — I4819 Other persistent atrial fibrillation: Secondary | ICD-10-CM

## 2016-05-27 SURGERY — CANCELLED PROCEDURE

## 2016-05-27 MED ORDER — SODIUM CHLORIDE 0.9 % IV SOLN: INTRAVENOUS | Status: DC

## 2016-05-27 NOTE — Progress Notes (Signed)
Pt to unit for cardioversion; found to be in sinus rhythm and confirmed with EKG; Dr Rennis GoldenHilty notified and has spoken with patient and family; discharged home

## 2016-05-27 NOTE — H&P (Signed)
    INTERVAL PROCEDURE H&P  History and Physical Interval Note:  05/27/2016 11:44 AM  Lucas [name] has presented today for their planned procedure. The various methods of treatment have been discussed with the patient and family. After consideration of risks, benefits and other options for treatment, the patient has consented to the procedure.  The patients' outpatient history has been reviewed, patient examined, and no change in status from most recent office note within the past 30 days. I have reviewed the patients' chart and labs and will proceed as planned. Questions were answered to the patient's satisfaction.   [name], MD, [facility] Attending Cardiologist [facility]  [name] 05/27/2016, 11:44 AM

## 2016-05-27 NOTE — Patient Outreach (Signed)
[facility] Care Management  05/27/2016  Lucas [name] 10/22/1933 [id number]   Hospital admission: 12/6-12/02/2016-Acute on chronic systolic & diastolic HF  EMMI-HF referral via red dashboard for new /worsening problems  12/18 and did not weigh 05/25/2016:  Telephone call to patient who was advised of reason for call follow up after receiving information from electronic calls that he had received.  HIPPA verification received from patient. Patient voices that he did not have any new or worsening problems since his discharge from hospital. Also voices that he has been weighing daily since discharge and that weight was decreasing. States currently he is on fluid restriction and following no salt diet. Voices that he is taking medications as prescribed by his doctor and having no problems getting prescriptions filled. States he does not need walker or cane while walking & that he continues to drive.   Re-enforcement of HF systems & action plan reviewed with patient who voices understanding.   EMMI-HF dashboard has been addressed.   Will send EMMI educational material for HF. Close case.    Colleen CanLinda Sade Mehlhoff, RN BSN CCM Care Management Coordinator [facility] Care Management  [phone]

## 2016-06-04 ENCOUNTER — Other Ambulatory Visit: Payer: Self-pay | Admitting: *Deleted

## 2016-06-04 NOTE — Patient Outreach (Signed)
[facility] Care Management  06/04/2016  Marliss CootsBobby R Sandles 10/06/1933 [id number]  Referral via EMMI-HF- red dashboard on new prescription filled.  Telephone call to patient; left message on voice mail requesting return call.  Plan: Will follow up /office closed Jan 1.  Colleen CanLinda Danyel Tobey, RN BSN CCM Care Management Coordinator [facility] Care Management  [phone]

## 2016-06-08 ENCOUNTER — Other Ambulatory Visit: Payer: Self-pay | Admitting: *Deleted

## 2016-06-08 NOTE — Patient Outreach (Signed)
[facility] Care Management  06/08/2016  [name] 09/01/1933 [id number]  Referral via EMMI-Heart Failure_RED Flag on dashboard new prescription 06/01/2016.  Telephone call x 2; spouse answered call & advised that she is taking all calls for patient.  Patient gave HIPPA verification & authorized spouse to speak with this RN care coordinator about his health concerns.   Spouse voices that she has had no difficulty getting new prescriptions filled for spouse. Voices that she uses [facility] and is managing patient's medication and making sure he is taking as prescribed by his doctors.   States patient is weighing daily and has not gained weight since hospital discharge. Spouse is aware of reportable symptoms such as 3 lb weight gain in 1 day & 5 lb weight gain in 1 week. States she would call 911 if spouse has chest pain & trouble breathing. Voices that she received educational literature on heart failure & understands. No questions about literature.   Spouse states patient has not had any emergency room visits or hospital admissions since discharge 12/9. States he had attended cardiology follow up appointments & primary care appointment has been scheduled. States she will try to get earlier appointment with primary.   EMMI HF call completed.   Plan: Will close case. Send to Care Management Assistant  Colleen CanLinda Norrine Ballester, RN BSN CCM Care Management Coordinator [facility]  [phone] .

## 2016-06-11 ENCOUNTER — Ambulatory Visit (INDEPENDENT_AMBULATORY_CARE_PROVIDER_SITE_OTHER): Payer: Medicare Other | Admitting: Endocrinology

## 2016-06-11 ENCOUNTER — Encounter: Payer: Self-pay | Admitting: Endocrinology

## 2016-06-11 VITALS — BP 116/62 | HR 63 | Ht 66.0 in | Wt 183.0 lb

## 2016-06-11 DIAGNOSIS — L97919 Non-pressure chronic ulcer of unspecified part of right lower leg with unspecified severity: Secondary | ICD-10-CM

## 2016-06-11 DIAGNOSIS — I509 Heart failure, unspecified: Secondary | ICD-10-CM | POA: Diagnosis not present

## 2016-06-11 DIAGNOSIS — E119 Type 2 diabetes mellitus without complications: Secondary | ICD-10-CM

## 2016-06-11 DIAGNOSIS — E876 Hypokalemia: Secondary | ICD-10-CM

## 2016-06-11 LAB — BASIC METABOLIC PANEL
BUN: 18 mg/dL (ref 6–23)
CHLORIDE: 93 meq/L — AB (ref 96–112)
CO2: 36 mEq/L — ABNORMAL HIGH (ref 19–32)
Calcium: 9.3 mg/dL (ref 8.4–10.5)
Creatinine, Ser: 1.14 mg/dL (ref 0.40–1.50)
GFR: 79.34 mL/min (ref >60.00)
GLUCOSE: 129 mg/dL — AB (ref 70–99)
POTASSIUM: 2.9 meq/L — AB (ref 3.5–5.1)
SODIUM: 138 meq/L (ref 135–145)

## 2016-06-11 LAB — BRAIN NATRIURETIC PEPTIDE: Pro B Natriuretic peptide (BNP): 637 pg/mL — ABNORMAL HIGH (ref 0.0–100.0)

## 2016-06-11 NOTE — Progress Notes (Signed)
Subjective:    Patient ID: Lucas [name], male    DOB: 07/28/1933, 83 y.o.   MRN: [id number]  HPI  Pt states few weeks of slight congestion in the nose, but no assoc sore throat.   Pt was recently in the hospital with CHF.   Past Medical History:  Diagnosis Date  . Acute CHF (congestive heart failure) (HCC) dx'd 04/2016  . Acute on chronic systolic and diastolic heart failure, NYHA class 3 (HCC) 05/12/2016  . ALLERGIC RHINITIS 01/25/2007  . Arthritis    "back, legs, shoulders, arms, qwhere" (05/12/2016)  . Atrial fibrillation with RVR (HCC)    [name]/notes 05/12/2016  . Chronic lower back pain   . DM 07/24/2008  . 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 Gout   . HYPERCHOLESTEROLEMIA 06/19/2008  . HYPERTENSION 06/19/2008  . HYPOKALEMIA 07/24/2008  . Irregular heart beat   . 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 Pre-diabetes    "borderline" (05/12/2016)  . PVD (peripheral vascular disease) (HCC)    [name]/notes 05/12/2016  . TIA 06/26/2008    Past Surgical History:  Procedure Laterality Date  . BACK SURGERY    . CATARACT EXTRACTION W/ INTRAOCULAR LENS IMPLANT Left   . INGUINAL HERNIA REPAIR Left   . KNEE ARTHROSCOPY Left   . LUMBAR DISC SURGERY  1980s   "got hurt on the job"  . TONSILLECTOMY      Social History   Social History  . Marital status: Married    Spouse name: N/A  . Number of children: N/A  . Years of education: N/A   Occupational History  . Not on file.   Social History Main Topics  . Smoking status: Never Smoker  . Smokeless tobacco: Never Used  . Alcohol use No  . Drug use: No  . Sexual activity: Not on file   Other Topics Concern  . Not on file   Social History Narrative  . No narrative on file    Current Outpatient Prescriptions on File Prior to Visit  Medication Sig Dispense Refill  . acetaminophen (TYLENOL) 325 MG tablet Take 325 mg by mouth every 6 (six) hours as needed.    Marland Kitchen. allopurinol (ZYLOPRIM) 300 MG tablet Take 1  tablet (300 mg total) by mouth daily. 30 tablet 6  . amiodarone (PACERONE) 200 MG tablet Take 2 tablets (400 mg total) by mouth 2 (two) times daily. 2 tabs bid x 5 days, then 1 tab bid x 7 day, then 1 tab daily 45 tablet 3  . apixaban (ELIQUIS) 5 MG TABS tablet Take 1 tablet (5 mg total) by mouth 2 (two) times daily. 60 tablet 11  . carvedilol (COREG) 6.25 MG tablet Take 1 tablet (6.25 mg total) by mouth 2 (two) times daily. 60 tablet 3  . furosemide (LASIX) 80 MG tablet Take 1 tablet (80 mg total) by mouth 2 (two) times daily. 60 tablet 3  . magnesium hydroxide (MILK OF MAGNESIA) 400 MG/5ML suspension Take 30 mLs by mouth daily as needed for mild constipation or indigestion.     No current facility-administered medications on file prior to visit.     Allergies  Allergen Reactions  . Sulfonamide Derivatives Nausea And Vomiting    No family history on file.  BP 116/62   Pulse 63   Ht 5\' 6"  (1.676 m)   Wt 183 lb (83 kg)   SpO2 94%   BMI 29.54 kg/m    Review of Systems He has lost weight.  Denies fever and cough.      Objective:   Physical Exam VITAL SIGNS:  See vs page.   GENERAL: no distress.  head: no deformity  eyes: no periorbital swelling, no proptosis  external nose and ears are normal.   mouth: no lesion seen.   LUNGS:  Clear to auscultation Both eac's and tm's are normal.   Right leg: 5 cm shallow ulcer  Lab Results  Component Value Date   CREATININE 1.14 06/11/2016   BUN 18 06/11/2016   NA 138 06/11/2016   K 2.9 (L) 06/11/2016   CL 93 (L) 06/11/2016   CO2 36 (H) 06/11/2016       Assessment & Plan:  Allergic rhinitis, vs URI.  Leg ulcer, new. CHF, clinically improved despite high BNP.  Please continue the same medications.  Hypokalemia, new. I sent rx.  Recheck next week.   Patient is advised the following: Patient Instructions  blood tests are requested for you today.  We'll let you know about the results.   Take robitussin as needed for your  symptoms.  Call if you have fever.   Please see a wound care specialist.  you will receive a phone call, about a day and time for an appointment Please come back for a "medicare wellness" appointment in 2 months.

## 2016-06-11 NOTE — Patient Instructions (Addendum)
blood tests are requested for you today.  We'll let you know about the results.   Take robitussin as needed for your symptoms.  Call if you have fever.   Please see a wound care specialist.  you will receive a phone call, about a day and time for an appointment Please come back for a "medicare wellness" appointment in 2 months.

## 2016-06-12 MED ORDER — POTASSIUM CHLORIDE CRYS ER 20 MEQ PO TBCR: 20.0000 meq | EXTENDED_RELEASE_TABLET | Freq: Every day | ORAL | 11 refills | Status: DC

## 2016-06-16 ENCOUNTER — Ambulatory Visit (INDEPENDENT_AMBULATORY_CARE_PROVIDER_SITE_OTHER): Payer: Medicare Other | Admitting: Physician Assistant

## 2016-06-16 ENCOUNTER — Other Ambulatory Visit: Payer: Self-pay | Admitting: *Deleted

## 2016-06-16 VITALS — BP 128/66 | HR 79 | Ht 66.0 in | Wt 184.0 lb

## 2016-06-16 DIAGNOSIS — I480 Paroxysmal atrial fibrillation: Secondary | ICD-10-CM

## 2016-06-16 DIAGNOSIS — Z5181 Encounter for therapeutic drug level monitoring: Secondary | ICD-10-CM

## 2016-06-16 DIAGNOSIS — I10 Essential (primary) hypertension: Secondary | ICD-10-CM

## 2016-06-16 DIAGNOSIS — I5022 Chronic systolic (congestive) heart failure: Secondary | ICD-10-CM | POA: Diagnosis not present

## 2016-06-16 MED ORDER — FUROSEMIDE 40 MG PO TABS: 40.0000 mg | ORAL_TABLET | Freq: Every day | ORAL | 3 refills | Status: DC

## 2016-06-16 MED ORDER — AMIODARONE HCL 200 MG PO TABS: 200.0000 mg | ORAL_TABLET | Freq: Every day | ORAL | 3 refills | Status: DC

## 2016-06-16 NOTE — Patient Outreach (Signed)
[facility] Care Management  06/16/2016  Lucas [name] 08/07/1933 [id number]  EMMI-Heart Failure referral-dashboard red -lost interest in things:  Telephone call to patient; left message requesting call back.  Plan: Follow up.  Colleen CanLinda Raheel Kunkle, RN BSN CCM Care Management Coordinator [facility]  [phone]

## 2016-06-16 NOTE — Patient Instructions (Addendum)
Medication Instructions:   START TAKING AMIODARONE 200 MG  ONCE A DAY   START TAKING LASIX 40 MG ONCE  A DAY   START TAKING KDUR 20 MEQ TWICE A DAY FOR  TWO DAYS  THEN START TAKING ONCE A DAY    If you need a refill on your cardiac medications before your next appointment, please call your pharmacy.  Labwork: RETURN IN ONE WEEK BMET  TSH AND LFT .Marland Kitchen.   Testing/Procedures: NONE ORDERED  TODAY    Follow-Up: KEEP APPT WITH PRIMARY CARE DOCTER  IN 2 WEEKS   IN ONE MONTH WITH [name] URUSY   Any Other Special Instructions Will Be Listed Below (If Applicable).

## 2016-06-16 NOTE — Progress Notes (Signed)
Cardiology Office Note Date:  06/16/2016  Patient ID:  Lucas [name], Lucas [name] [birth year], MRN [id number] PCP:  Romero Belling, MD  Cardiologist:  Dr. Graciela Husbands (new 04/27/16)   Chief Complaint:  Medication concerns  History of Present Illness: Lucas [name] is a 83 y.o. male with history of HTN, HLD, TIA, PVD w/known carotid dz, was seen by Dr. Graciela Husbands 04/27/16 as a work-in new patient referred by the PMD for concerns of heart failure symptoms he was found in coarse AFib w/RVR and started on Coreg 6.25mg  BID, his furosemide increased to 80mg  daily, and started on Aldactone 25mg  daily given hx of hypokalemia.    He was seen in f/u by myself noting some confusion in his meds and had been off them for a couple days, he was still in RAFib, hypokalemic and his K+ was being replaced and he was resumed on his coreg and lasix noting his weight was up but not symptomatic.  A week laater 05/12/16 he was seen by Dicky Doe, PA and Dr. Clifton James wit persistent fluid OL, symptoms of CHF and admitted to the hospital.  Notes reviewed and she noting that intermittently he refused meds, insisted on going home only a couple days into his stay and planned for early f/u.  He saw Dr. Graciela Husbands 05/18/16 he was still in AF though improved rate, remained w/fluid OL and continued diuresis and planned for DCCV though when he arrived was in SR.  He saw his PMD 06/11/16, lab noted K+ 2.9, PMD started K+ replacement with f/u planned, however the patient has not started taking it yet, his wife states she wasn't sure it was safe with his heart medicines. The patient denies any kind of CP, no palpitations, no rest or nighttime SOB but get winded easily, though much improved from prior to going into the hospital.  He feels like he doesn't have much energy.  No dizziness, near syncope or syncope.  He is very bothered with the frequent urination and wants to decrease the lasix dose.  He has a wound R shin, sees wound care clinic via his  PMD, reports nearly healed.  AFib Hx First noted Nov 2017 Amiodarone started Dec 2017   Past Medical History:  Diagnosis Date  . Acute CHF (congestive heart failure) (HCC) dx'd 04/2016  . Acute on chronic systolic and diastolic heart failure, NYHA class 3 (HCC) 05/12/2016  . ALLERGIC RHINITIS 01/25/2007  . Arthritis    "back, legs, shoulders, arms, qwhere" (05/12/2016)  . Atrial fibrillation with RVR (HCC)    Lucas [name] 05/12/2016  . Chronic lower back pain   . DM 07/24/2008  . 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 Gout   . HYPERCHOLESTEROLEMIA 06/19/2008  . HYPERTENSION 06/19/2008  . HYPOKALEMIA 07/24/2008  . Irregular heart beat   . 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 Pre-diabetes    "borderline" (05/12/2016)  . PVD (peripheral vascular disease) (HCC)    Lucas [name] 05/12/2016  . TIA 06/26/2008    Past Surgical History:  Procedure Laterality Date  . BACK SURGERY    . CATARACT EXTRACTION W/ INTRAOCULAR LENS IMPLANT Left   . INGUINAL HERNIA REPAIR Left   . KNEE ARTHROSCOPY Left   . LUMBAR DISC SURGERY  1980s   "got hurt on the job"  . TONSILLECTOMY      Current Outpatient Prescriptions  Medication Sig Dispense Refill  . acetaminophen (TYLENOL) 325 MG tablet Take 325 mg by mouth every 6 (six) hours as needed.    Marland Kitchen  allopurinol (ZYLOPRIM) 300 MG tablet Take 1 tablet (300 mg total) by mouth daily. 30 tablet 6  . amiodarone (PACERONE) 200 MG tablet Take 2 tablets (400 mg total) by mouth 2 (two) times daily. 2 tabs bid x 5 days, then 1 tab bid x 7 day, then 1 tab daily 45 tablet 3  . apixaban (ELIQUIS) 5 MG TABS tablet Take 1 tablet (5 mg total) by mouth 2 (two) times daily. 60 tablet 11  . carvedilol (COREG) 6.25 MG tablet Take 1 tablet (6.25 mg total) by mouth 2 (two) times daily. 60 tablet 3  . furosemide (LASIX) 80 MG tablet Take 1 tablet (80 mg total) by mouth 2 (two) times daily. 60 tablet 3  . magnesium hydroxide (MILK OF MAGNESIA) 400 MG/5ML suspension  Take 30 mLs by mouth daily as needed for mild constipation or indigestion.    . potassium chloride SA (K-DUR,KLOR-CON) 20 MEQ tablet Take 1 tablet (20 mEq total) by mouth daily. 30 tablet 11   No current facility-administered medications for this visit.     Allergies:   Sulfonamide derivatives   Social History:  The patient  reports that he has never smoked. He has never used smokeless tobacco. He reports that he does not drink alcohol or use drugs.   Family History:  The patient's family history is not on file.  ROS:  Please see the history of present illness.    All other systems are reviewed and otherwise negative.   PHYSICAL EXAM:  VS:  There were no vitals taken for this visit. BMI: There is no height or weight on file to calculate BMI. Well nourished, well developed, in no acute distress  HEENT: normocephalic, atraumatic  Neck: no JVD, carotid bruits or masses Cardiac: IRRR; no significant murmurs, no rubs, or gallops Lungs:  clear to auscultation bilaterally, no wheezing, rhonchi or rales  Abd: soft, nontender MS: no deformity or atrophy Ext:  Dressing R LE, no pitting edema is appreciated b/l LE Skin: warm and dry, no rash Neuro:  No gross deficits appreciated Psych: euthymic mood, full affect   EKG:  Done today and reviewed by myself, SR,ectopic beats, APC's, blocked APC, QT measured at 460, QTc 05/27/16 SR/ectopic rhythm, PVCs 05/13/16: TTE Study Conclusions - Left ventricle: The cavity size was moderately dilated. Systolic   function was mildly to moderately reduced. The estimated ejection   fraction was in the range of 40% to 45%. Wall motion was normal;   there were no regional wall motion abnormalities. The study was   not technically sufficient to allow evaluation of LV diastolic   dysfunction due to atrial fibrillation. Doppler parameters are   consistent with high ventricular filling pressure. - Aortic valve: Moderately calcified annulus. Trileaflet;  normal   thickness, moderately calcified leaflets. Moderate focal   calcification involving the noncoronary cusp. - Mitral valve: Calcified annulus. There was mild regurgitation. - Left atrium: The atrium was moderately dilated. - Pulmonary arteries: PA peak pressure: 47 mm Hg (S). Impressions: - The right ventricular systolic pressure was increased consistent   with moderate pulmonary hypertension.  2008 Vascular evaluation Dr. Darrick [name]  04/2007 Carotid US >80% stenosis L ICA, <40% right Recommended CEA but patient declined  Recent Labs: 05/12/2016: ALT 23; B Natriuretic Peptide 660.7; Hemoglobin 14.8; Platelets 165; TSH 2.098 06/11/2016: BUN 18; Creatinine, Ser 1.14; Potassium 2.9; Pro B Natriuretic peptide (BNP) 637.0; Sodium 138  07/01/2015: Cholesterol 199; HDL 43.10; LDL Cholesterol 136; Total CHOL/HDL Ratio 5; Triglycerides 99.0; VLDL  19.8   Estimated Creatinine Clearance: 52.3 mL/min (by C-G formula based on SCr of 1.14 mg/dL).   Wt Readings from Last 3 Encounters:  06/11/16 183 lb (83 kg)  05/18/16 196 lb 12.8 oz (89.3 kg)  05/15/16 203 lb 9.6 oz (92.4 kg)     Other studies reviewed: Additional studies/records reviewed today include: summarized above  ASSESSMENT AND PLAN:  1. PAFib     CHA2DS2Vasc is at least 3 on Eliquis     Amiodarone is at 200mg  daily     Discussed avoiding stimulants      2. CHF, fluid OL     His edema is resolved Weight remains down, denies any rest SOB, no PND or orthopnea symptoms.  He has some exertional SOB, though is better.   3. HTN     stable  4. Hypokalemia     did not follow instruction by PMD  Will decrease his lasix to 40mg  daily, start Kdur 20meq BID for 2 days then daily with BMET in 1 week, encouraged potasium rich foods May benefit from resuming his aldactone should he need more diuresis going forward rather then increased lasix dosing      Discussed today importance of medicine compliance, encouraged to call if  questions/concerns rather then not take his medicines Discussed importance of minimizing sodium in his diet Discussed daily weights and to notify if he gains 3lbs or more in 24 hours or 5lbs in a week  5. Known severe carotid disease from 2008     Pt then refused intervention, was recommended L CEA     No neuro symptoms      Disposition: BMET, TSH, LFT's in 1 week.  He sees his PMD in 2 weeks, will see him back him here in 1 month, sooner if needed, compliance with medicines has been difficult for a couple of reasons it seems, will see him frequently for now at least.  Current medicines are reviewed at length with the patient today.  The patient did not have any concerns regarding medicines.  Judith BlonderSigned, Travonte Byard Ursy, PA-C 06/16/2016 4:53 AM     [facility] [address] [place] [address] [phone] (office)  [phone] (fax)

## 2016-06-17 ENCOUNTER — Other Ambulatory Visit: Payer: Self-pay | Admitting: *Deleted

## 2016-06-17 NOTE — Patient Outreach (Signed)
[facility] Care Management  06/17/2016  Marliss CootsBobby R Haberkorn 04/25/1934 [id number]   EMMI-Heart Failure dashboard referral:  Telephone call x 2; left message on voice mail requesting call back.  Plan: Will follow up.  Colleen CanLinda Connor Meacham, RN BSN CCM Care Management Coordinator [facility] Care [facility]  [phone]

## 2016-06-18 ENCOUNTER — Other Ambulatory Visit: Payer: Medicare Other

## 2016-06-18 ENCOUNTER — Other Ambulatory Visit: Payer: Self-pay | Admitting: *Deleted

## 2016-06-18 NOTE — Patient Outreach (Signed)
[facility] Care Management  06/18/2016  Lucas [name] 12/06/1933 [id number]   EMMI IHF referral-red dashboard on lost of interest in doing things:  Call x 3; telephone call to patient; left message with spouse who will have patient return call.  1620 pm-Received return call from patient. HIPPA verification received from patient.   Patient voices that he is doing well & has not lost interest in doing things. States he still drives & gets out during the day & does some of the things he likes to do. States he is not depressed.   Patient states he continues to take medications as prescribed and is weighing daily. States he knows to report 3 lb weigh gain to his doctor. States he has attended appointments with primary care provider and cardiologist.  Patient voices no concerns at this time.  EMMI addressed & call completed.  Plan; Will close case. Send to care management assistant.    Lucas CanLinda Rayen Dafoe, RN BSN CCM Care Management Coordinator [facility] Care [facility]  [phone]

## 2016-06-23 ENCOUNTER — Other Ambulatory Visit: Payer: Medicare Other

## 2016-06-28 ENCOUNTER — Telehealth: Payer: Self-pay | Admitting: Physician Assistant

## 2016-06-28 NOTE — Telephone Encounter (Signed)
New Message:  Pt stop taking the Potassium and Allopurinol,they were making his feet swell a little.When he does not take these medicine,his feet does not swell.

## 2016-06-30 ENCOUNTER — Other Ambulatory Visit: Payer: Medicare Other | Admitting: *Deleted

## 2016-06-30 DIAGNOSIS — I480 Paroxysmal atrial fibrillation: Secondary | ICD-10-CM | POA: Diagnosis not present

## 2016-06-30 DIAGNOSIS — Z5181 Encounter for therapeutic drug level monitoring: Secondary | ICD-10-CM

## 2016-06-30 NOTE — Telephone Encounter (Signed)
SPOKE TO PT WIFE ABOUT SYMPTOMS STOPPING THE POTASSIUM AND ALLOPURINOL. SHE SAID  PT SWELLING HAS GONE DOWN  AND HAS STARTED TAKING 80 MG OF LASIX BECAUSE  THE 40 MG WASN'T DOING MUCH.    IT WAS EXPLAINED THE IMPORTANCE OF TAKING THE POTASSIUM PILL WITH TAKING  LASIX ,ESPECIALLY DOUBLING THE LASIX DOSE CAUSING LOW POTASSIUM LEVELS.  WAS ASKED IF COULD BRING HUSBAND  IN TODAY TO  GET FOLLOW UP LABS THAT  WAS REQUESTED LAST OV BUT  DUE TO WEATHER INTERFERENCES DIDN'T MAKE IT.   PT WIFE AGREED TO BRING HUSBAND  IN TODAY FOR LABS AFTER LUNCH

## 2016-06-30 NOTE — Telephone Encounter (Signed)
MEDICATION LIST WAS VERIFIED AND LASIX AND ALLOPURINOL WAS ONLY CHANGES.

## 2016-07-01 LAB — BASIC METABOLIC PANEL
BUN / CREAT RATIO: 12 (ref 10–24)
BUN: 16 mg/dL (ref 8–27)
CALCIUM: 8.9 mg/dL (ref 8.6–10.2)
CO2: 27 mmol/L (ref 18–29)
CREATININE: 1.33 mg/dL — AB (ref 0.76–1.27)
Chloride: 99 mmol/L (ref 96–106)
GFR calc Af Amer: 58 mL/min/{1.73_m2} — ABNORMAL LOW (ref >59)
GFR, EST NON AFRICAN AMERICAN: 50 mL/min/{1.73_m2} — AB (ref >59)
Glucose: 61 mg/dL — ABNORMAL LOW (ref 65–99)
Potassium: 3.1 mmol/L — ABNORMAL LOW (ref 3.5–5.2)
Sodium: 144 mmol/L (ref 134–144)

## 2016-07-01 LAB — HEPATIC FUNCTION PANEL
ALBUMIN: 3.2 g/dL — AB (ref 3.5–4.7)
ALK PHOS: 187 IU/L — AB (ref 39–117)
ALT: 12 IU/L (ref 0–44)
AST: 19 IU/L (ref 0–40)
BILIRUBIN, DIRECT: 0.6 mg/dL — AB (ref 0.00–0.40)
Bilirubin Total: 1.4 mg/dL — ABNORMAL HIGH (ref 0.0–1.2)
Total Protein: 6.7 g/dL (ref 6.0–8.5)

## 2016-07-01 LAB — TSH: TSH: 3.49 u[IU]/mL (ref 0.450–4.500)

## 2016-07-02 ENCOUNTER — Other Ambulatory Visit: Payer: Self-pay | Admitting: *Deleted

## 2016-07-02 DIAGNOSIS — E876 Hypokalemia: Secondary | ICD-10-CM

## 2016-07-02 DIAGNOSIS — Z5181 Encounter for therapeutic drug level monitoring: Secondary | ICD-10-CM

## 2016-07-02 MED ORDER — SPIRONOLACTONE 25 MG PO TABS: 25.0000 mg | ORAL_TABLET | Freq: Every day | ORAL | 1 refills | Status: DC

## 2016-07-02 NOTE — Patient Outreach (Signed)
[facility] Care Management  07/02/2016  Marliss CootsBobby R Mendonsa 03/27/1934 [id number]  EMMI-Heart Failure referral-red dashboard for " meds to take'?  Telephone call to patient; left message on voice mail requesting call back.  Plan: Will follow up.  Colleen CanLinda Christopher Glasscock, RN BSN CCM Care Management Coordinator [facility] Care [facility]  [phone]

## 2016-07-05 ENCOUNTER — Encounter (HOSPITAL_BASED_OUTPATIENT_CLINIC_OR_DEPARTMENT_OTHER): Payer: Medicare Other | Attending: Internal Medicine

## 2016-07-05 ENCOUNTER — Other Ambulatory Visit: Payer: Self-pay | Admitting: *Deleted

## 2016-07-05 NOTE — Patient Outreach (Signed)
[facility] Care Management  07/05/2016  Lucas [name] 02/29/1934 [id number]  Telephone call x 2 to patient; left message on voice mail requesting call back.  Plan:  Follow up with patient.   Colleen CanLinda Arleigh Odowd, RN BSN CCM Care Management Coordinator [facility] Care [facility]  [phone]

## 2016-07-09 ENCOUNTER — Other Ambulatory Visit: Payer: Medicare Other | Admitting: *Deleted

## 2016-07-09 DIAGNOSIS — Z5181 Encounter for therapeutic drug level monitoring: Secondary | ICD-10-CM | POA: Diagnosis not present

## 2016-07-09 DIAGNOSIS — E876 Hypokalemia: Secondary | ICD-10-CM | POA: Diagnosis not present

## 2016-07-09 LAB — BASIC METABOLIC PANEL
BUN/Creatinine Ratio: 11 (ref 10–24)
BUN: 15 mg/dL (ref 8–27)
CALCIUM: 9 mg/dL (ref 8.6–10.2)
CHLORIDE: 97 mmol/L (ref 96–106)
CO2: 28 mmol/L (ref 18–29)
Creatinine, Ser: 1.39 mg/dL — ABNORMAL HIGH (ref 0.76–1.27)
GFR, EST AFRICAN AMERICAN: 55 mL/min/{1.73_m2} — AB (ref >59)
GFR, EST NON AFRICAN AMERICAN: 48 mL/min/{1.73_m2} — AB (ref >59)
Glucose: 143 mg/dL — ABNORMAL HIGH (ref 65–99)
Potassium: 3.1 mmol/L — ABNORMAL LOW (ref 3.5–5.2)
Sodium: 142 mmol/L (ref 134–144)

## 2016-07-12 ENCOUNTER — Telehealth: Payer: Self-pay | Admitting: Physician Assistant

## 2016-07-12 NOTE — Telephone Encounter (Signed)
In regards to labs I Spoke with the patient (his wife was on Speaker phone who helps him with his medicines).  He is feeling well, taking lasix 40mg  daily with the Spironolactone 25mg  daily, reports he has not taken any extra lasix that his swelling has stayed down, he denies SOB, reports his BP "good" 120-150/80-90 range, he does not check his HR.  I instructed him to take the Kdur 20meq every other day and we will check his lab Monday when I see him.  Re-instructed to call with any medicine questions before making any changes on his own.  Francis Dowseenee Daveion Robar, PA-C

## 2016-07-18 NOTE — Progress Notes (Signed)
Cardiology Office Note Date:  07/19/2016  Patient ID:  Lucas [name], Lucas [name] [birth year], MRN [id number] PCP:  Romero Belling, MD  Cardiologist:  Dr. Graciela Husbands (new 04/27/16)   Chief Complaint:  Medication concerns  History of Present Illness: Lucas [name] is a 83 y.o. male with history of HTN, HLD, TIA, PVD w/known carotid dz, was seen by Dr. Graciela Husbands 04/27/16 as a work-in new patient referred by the PMD for concerns of heart failure symptoms he was found in coarse AFib w/RVR and started on Coreg 6.25mg  BID, his furosemide increased to 80mg  daily, and started on Aldactone 25mg  daily given hx of hypokalemia.    He was seen in f/u by myself noting some confusion in his meds and had been off them for a couple days, he was still in RAFib, hypokalemic and his K+ was being replaced and he was resumed on his coreg and lasix noting his weight was up but not symptomatic.  A week laater 05/12/16 he was seen by Dicky Doe, PA and Dr. Clifton James wit persistent fluid OL, symptoms of CHF and admitted to the hospital.  Notes reviewed and she noting that intermittently he refused meds, insisted on going home only a couple days into his stay and planned for early f/u.  He saw Dr. Graciela Husbands 05/18/16 he was still in AF though improved rate, remained w/fluid OL and continued diuresis and planned for DCCV though when he arrived was in SR.  We have kept close f/u given his somewhat non-compliance with medicines (often self adjusting his medicines and significant hypokalemia.  Most recently aldactone was added, and then QOD Kdur (patient has reported intolerance of K+ with GI upset).  The patient denies any kind of CP, no palpitations, no rest or nighttime SOB but gets winded easily, though again this remains generally improved from prior to going into the hospital.  He again feels like he doesn't have much energy.  No dizziness, near syncope or syncope.  He doesn't sleep well getting up often to urinate, he denies symptoms  of PND or orthopnea.  He stopped the Kdur with increasing swelling of his LE, state his swelling was pretty well managed until he started, after his second dose, he has been off it a couple days though without improvement.      AFib Hx First noted Nov 2017 Amiodarone started Dec 2017 Discussed annual eye exams, he sees an eye doctor bi-annually 06/30/16 TSH 3.490, ALT 12, AST 19 Will need  PFTs once fluid status is stable  Past Medical History:  Diagnosis Date  . Acute CHF (congestive heart failure) (HCC) dx'd 04/2016  . Acute on chronic systolic and diastolic heart failure, NYHA class 3 (HCC) 05/12/2016  . ALLERGIC RHINITIS 01/25/2007  . Arthritis    "back, legs, shoulders, arms, qwhere" (05/12/2016)  . Atrial fibrillation with RVR (HCC)    Lucas [name] 05/12/2016  . Chronic lower back pain   . DM 07/24/2008  . 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 Gout   . HYPERCHOLESTEROLEMIA 06/19/2008  . HYPERTENSION 06/19/2008  . HYPOKALEMIA 07/24/2008  . Irregular heart beat   . 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 Pre-diabetes    "borderline" (05/12/2016)  . PVD (peripheral vascular disease) (HCC)    Lucas [name] 05/12/2016  . TIA 06/26/2008    Past Surgical History:  Procedure Laterality Date  . BACK SURGERY    . CATARACT EXTRACTION W/ INTRAOCULAR LENS IMPLANT Left   . INGUINAL HERNIA REPAIR Left   .  KNEE ARTHROSCOPY Left   . LUMBAR DISC SURGERY  1980s   "got hurt on the job"  . TONSILLECTOMY      Current Outpatient Prescriptions  Medication Sig Dispense Refill  . acetaminophen (TYLENOL) 325 MG tablet Take 325 mg by mouth every 6 (six) hours as needed.    Marland Kitchen. allopurinol (ZYLOPRIM) 300 MG tablet Take 1 tablet (300 mg total) by mouth daily. 30 tablet 6  . amiodarone (PACERONE) 200 MG tablet Take 1 tablet (200 mg total) by mouth daily. 90 tablet 3  . apixaban (ELIQUIS) 5 MG TABS tablet Take 1 tablet (5 mg total) by mouth 2 (two) times daily. 60 tablet 11  . carvedilol  (COREG) 6.25 MG tablet Take 1 tablet (6.25 mg total) by mouth 2 (two) times daily. 60 tablet 3  . furosemide (LASIX) 40 MG tablet Take 1 tablet (40 mg total) by mouth daily. 90 tablet 3  . magnesium hydroxide (MILK OF MAGNESIA) 400 MG/5ML suspension Take 30 mLs by mouth daily as needed for mild constipation or indigestion.    Marland Kitchen. spironolactone (ALDACTONE) 25 MG tablet Take 1 tablet (25 mg total) by mouth 2 (two) times daily. 180 tablet 1   No current facility-administered medications for this visit.     Allergies:   Sulfonamide derivatives   Social History:  The patient  reports that he has never smoked. He has never used smokeless tobacco. He reports that he does not drink alcohol or use drugs.   Family History:  The patient's family history includes Heart attack in his father; Stroke in his father; Uterine cancer in his sister.   ROS:  Please see the history of present illness.    All other systems are reviewed and otherwise negative.   PHYSICAL EXAM:  VS:  BP 134/62   Pulse (!) 58   Ht 5\' 6"  (1.676 m)   Wt 197 lb (89.4 kg)   BMI 31.80 kg/m  BMI: Body mass index is 31.8 kg/m. Well nourished, well developed, in no acute distress  HEENT: normocephalic, atraumatic  Neck: no JVD, carotid bruits or masses Cardiac: RRR; no significant murmurs, no rubs, or gallops Lungs:  CTA b/l, no wheezing, rhonchi or rales  Abd: soft, nontender MS: no deformity or atrophy Ext:   2+ edema b/l LE Skin: warm and dry, no rash Neuro:  No gross deficits appreciated Psych: euthymic mood, full affect   EKG:  Done today and reviewed by myself, SR, PAC's 05/27/16 SR/ectopic rhythm, PVCs 05/13/16: TTE Study Conclusions - Left ventricle: The cavity size was moderately dilated. Systolic   function was mildly to moderately reduced. The estimated ejection   fraction was in the range of 40% to 45%. Wall motion was normal;   there were no regional wall motion abnormalities. The study was   not technically  sufficient to allow evaluation of LV diastolic   dysfunction due to atrial fibrillation. Doppler parameters are   consistent with high ventricular filling pressure. - Aortic valve: Moderately calcified annulus. Trileaflet; normal   thickness, moderately calcified leaflets. Moderate focal   calcification involving the noncoronary cusp. - Mitral valve: Calcified annulus. There was mild regurgitation. - Left atrium: The atrium was moderately dilated. - Pulmonary arteries: PA peak pressure: 47 mm Hg (S). Impressions: - The right ventricular systolic pressure was increased consistent   with moderate pulmonary hypertension.  2008 Vascular evaluation Dr. Darrick PennaFields  04/2007 Carotid US >80% stenosis L ICA, <40% right Recommended CEA but patient declined  Recent Labs: 05/12/2016:  B Natriuretic Peptide 660.7; Hemoglobin 14.8; Platelets 165 06/11/2016: Pro B Natriuretic peptide (BNP) 637.0 06/30/2016: ALT 12; TSH 3.490 07/09/2016: BUN 15; Creatinine, Ser 1.39; Potassium 3.1; Sodium 142  No results found for requested labs within last 8760 hours.   Estimated Creatinine Clearance: 44.4 mL/min (by C-G formula based on SCr of 1.39 mg/dL (H)).   Wt Readings from Last 3 Encounters:  07/19/16 197 lb (89.4 kg)  06/16/16 184 lb (83.5 kg)  06/11/16 183 lb (83 kg)     Other studies reviewed: Additional studies/records reviewed today include: summarized above  ASSESSMENT AND PLAN:  1. PAFib     CHA2DS2Vasc is at least 3 on Eliquis     Amiodarone is at 200mg  daily     Discussed avoiding stimulants      2. CHF, fluid OL    Recurrent LE edema     Weight is up again,  denies any rest SOB, no PND or orthopnea symptoms.  He has some exertional SOB, though no worse then last visit     His wife states he drinks a fair amount of soda, coffee, drinks in general, discussed reduction to no more then 2L daily and avoiding sodium as well, though they state he doesn't do much in the way of salt.  He does say he has a  great appetite, and is eating well    Re-educated on daily weights and to notify if 3lb weight gain in 1 day or 5 in a week.    He reports he does not/can not take the Kdur with a number of symptoms he thinks are attributed to it, GI upset and now swelling    Increase his Aldactone to 25mg  BID with his current lasix dose    BMET in 1 week.      Notify if no reduction in edema or any increase or new symptoms    Pending his BMET,  next week will add on ACE/ARB     3. HTN     stable  4. Hypokalemia     Increase his Aldactone to BID     Encouraged K+ rich foods  5. Known severe carotid disease from 2008     Pt then refused intervention, was recommended L CEA     No neuro symptoms     Encouraged follow up for this as well as statin thx     They report his PMD manages his cholesterol, he was on Simvastatin at some point but was stopped because of a "reaction", though uncertain what this was.      Disposition: ROV 1 month, sooner if needed  Current medicines are reviewed at length with the patient today.  The patient did not have any concerns regarding medicines.  Judith Blonder, PA-C 07/19/2016 11:42 AM     [facility] [address] [place] [address] [phone] (office)  [phone] (fax)

## 2016-07-19 ENCOUNTER — Encounter (INDEPENDENT_AMBULATORY_CARE_PROVIDER_SITE_OTHER): Payer: Self-pay

## 2016-07-19 ENCOUNTER — Ambulatory Visit (INDEPENDENT_AMBULATORY_CARE_PROVIDER_SITE_OTHER): Payer: Medicare Other | Admitting: Physician Assistant

## 2016-07-19 ENCOUNTER — Encounter: Payer: Self-pay | Admitting: Physician Assistant

## 2016-07-19 VITALS — BP 134/62 | HR 58 | Ht 66.0 in | Wt 197.0 lb

## 2016-07-19 DIAGNOSIS — E876 Hypokalemia: Secondary | ICD-10-CM

## 2016-07-19 DIAGNOSIS — I480 Paroxysmal atrial fibrillation: Secondary | ICD-10-CM

## 2016-07-19 DIAGNOSIS — I10 Essential (primary) hypertension: Secondary | ICD-10-CM

## 2016-07-19 DIAGNOSIS — I5022 Chronic systolic (congestive) heart failure: Secondary | ICD-10-CM

## 2016-07-19 DIAGNOSIS — Z5181 Encounter for therapeutic drug level monitoring: Secondary | ICD-10-CM

## 2016-07-19 MED ORDER — SPIRONOLACTONE 25 MG PO TABS: 25.0000 mg | ORAL_TABLET | Freq: Two times a day (BID) | ORAL | 1 refills | Status: DC

## 2016-07-19 NOTE — Addendum Note (Signed)
Addended by: Oleta MouseVERTON, Suyash Amory M on: 07/19/2016 02:10 PM   Modules accepted: Orders

## 2016-07-19 NOTE — Patient Instructions (Signed)
Medication Instructions:   START TAKING ALDACTONE 25 MG TWICE A DAY    If you need a refill on your cardiac medications before your next appointment, please call your pharmacy.  Labwork: RETURN FOR BMET IN ONE WEEK    Testing/Procedures: NONE ORDERED  TODAY    Follow-Up: IN ONE MONTH WITH [name] URSUY  Any Other Special Instructions Will Be Listed Below (If Applicable).  PLEASE CONTACT OFFICE IF WEIGHT GAIN ONE 3 LBS IN 24 HOURS OR 5 LBS IN ON WEEK

## 2016-07-26 ENCOUNTER — Other Ambulatory Visit: Payer: Medicare Other | Admitting: *Deleted

## 2016-07-26 DIAGNOSIS — Z5181 Encounter for therapeutic drug level monitoring: Secondary | ICD-10-CM | POA: Diagnosis not present

## 2016-07-26 LAB — BASIC METABOLIC PANEL
BUN / CREAT RATIO: 13 (ref 10–24)
BUN: 16 mg/dL (ref 8–27)
CO2: 28 mmol/L (ref 18–29)
CREATININE: 1.27 mg/dL (ref 0.76–1.27)
Calcium: 9.2 mg/dL (ref 8.6–10.2)
Chloride: 96 mmol/L (ref 96–106)
GFR calc non Af Amer: 53 mL/min/{1.73_m2} — ABNORMAL LOW (ref >59)
GFR, EST AFRICAN AMERICAN: 61 mL/min/{1.73_m2} (ref >59)
Glucose: 99 mg/dL (ref 65–99)
Potassium: 3.1 mmol/L — ABNORMAL LOW (ref 3.5–5.2)
Sodium: 144 mmol/L (ref 134–144)

## 2016-07-27 ENCOUNTER — Encounter: Payer: Self-pay | Admitting: Endocrinology

## 2016-07-27 ENCOUNTER — Ambulatory Visit (INDEPENDENT_AMBULATORY_CARE_PROVIDER_SITE_OTHER): Payer: Medicare Other | Admitting: Endocrinology

## 2016-07-27 VITALS — BP 118/72 | HR 75 | Ht 66.0 in | Wt 195.0 lb

## 2016-07-27 DIAGNOSIS — J069 Acute upper respiratory infection, unspecified: Secondary | ICD-10-CM | POA: Diagnosis not present

## 2016-07-27 DIAGNOSIS — E119 Type 2 diabetes mellitus without complications: Secondary | ICD-10-CM

## 2016-07-27 DIAGNOSIS — Z0000 Encounter for general adult medical examination without abnormal findings: Secondary | ICD-10-CM | POA: Diagnosis not present

## 2016-07-27 LAB — POCT GLYCOSYLATED HEMOGLOBIN (HGB A1C): Hemoglobin A1C: 5.6

## 2016-07-27 MED ORDER — CEPHALEXIN 250 MG PO CAPS: 250.0000 mg | ORAL_CAPSULE | Freq: Three times a day (TID) | ORAL | 0 refills | Status: DC

## 2016-07-27 NOTE — Progress Notes (Signed)
Subjective:    Patient ID: Lucas [name], male    DOB: 04/19/1934, 83 y.o.   MRN: [id number]  HPI Pt states few weeks of slight congestion of the nose, but no assoc earache.   Leg swelling is the same.   Past Medical History:  Diagnosis Date  . Acute CHF (congestive heart failure) (HCC) dx'd 04/2016  . Acute on chronic systolic and diastolic heart failure, NYHA class 3 (HCC) 05/12/2016  . ALLERGIC RHINITIS 01/25/2007  . Arthritis    "back, legs, shoulders, arms, qwhere" (05/12/2016)  . Atrial fibrillation with RVR (HCC)    [name]/notes 05/12/2016  . Chronic lower back pain   . DM 07/24/2008  . 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 Gout   . HYPERCHOLESTEROLEMIA 06/19/2008  . HYPERTENSION 06/19/2008  . HYPOKALEMIA 07/24/2008  . Irregular heart beat   . 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 Pre-diabetes    "borderline" (05/12/2016)  . PVD (peripheral vascular disease) (HCC)    [name]/notes 05/12/2016  . TIA 06/26/2008    Past Surgical History:  Procedure Laterality Date  . BACK SURGERY    . CATARACT EXTRACTION W/ INTRAOCULAR LENS IMPLANT Left   . INGUINAL HERNIA REPAIR Left   . KNEE ARTHROSCOPY Left   . LUMBAR DISC SURGERY  1980s   "got hurt on the job"  . TONSILLECTOMY      Social History   Social History  . Marital status: Married    Spouse name: N/A  . Number of children: N/A  . Years of education: N/A   Occupational History  . Not on file.   Social History Main Topics  . Smoking status: Never Smoker  . Smokeless tobacco: Never Used  . Alcohol use No  . Drug use: No  . Sexual activity: Not on file   Other Topics Concern  . Not on file   Social History Narrative  . No narrative on file    Current Outpatient Prescriptions on File Prior to Visit  Medication Sig Dispense Refill  . acetaminophen (TYLENOL) 325 MG tablet Take 325 mg by mouth every 6 (six) hours as needed.    Marland Kitchen. amiodarone (PACERONE) 200 MG tablet Take 1 tablet (200 mg total)  by mouth daily. 90 tablet 3  . apixaban (ELIQUIS) 5 MG TABS tablet Take 1 tablet (5 mg total) by mouth 2 (two) times daily. 60 tablet 11  . carvedilol (COREG) 6.25 MG tablet Take 1 tablet (6.25 mg total) by mouth 2 (two) times daily. 60 tablet 3  . furosemide (LASIX) 40 MG tablet Take 1 tablet (40 mg total) by mouth daily. 90 tablet 3  . magnesium hydroxide (MILK OF MAGNESIA) 400 MG/5ML suspension Take 30 mLs by mouth daily as needed for mild constipation or indigestion.    Marland Kitchen. spironolactone (ALDACTONE) 25 MG tablet Take 1 tablet (25 mg total) by mouth 2 (two) times daily. 180 tablet 1   No current facility-administered medications on file prior to visit.     Allergies  Allergen Reactions  . Sulfonamide Derivatives Nausea And Vomiting    Family History  Problem Relation Age of Onset  . Stroke Father   . Heart attack Father   . Uterine cancer Sister     BP 118/72   Pulse 75   Ht 5\' 6"  (1.676 m)   Wt 195 lb (88.5 kg)   SpO2 93%   BMI 31.47 kg/m    Review of Systems Denies sob and fever.  Objective:   Physical Exam VITAL SIGNS:  See vs page.  GENERAL: no distress.  head: no deformity  eyes: no periorbital swelling, no proptosis  external nose and ears are normal  mouth: no lesion seen Both eac's and tm's are normal LUNGS:  Clear to auscultation.   Pulses: dorsalis pedis intact bilat.   MSK: no deformity of the feet.   CV: 2+ bilat leg edema.  Skin:  no ulcer on the feet, but there is a shallow ulcer at the right leg.  normal color and temp on the feet.  Neuro: sensation is intact to touch on the feet.     A1c=5.6%    Assessment & Plan:  URI: new.  I rx'ed antibiotic.  Type 2 DM: well-controlled.  No medication is needed now.   Subjective:   Patient here for Medicare annual wellness visit and management of other chronic and acute problems.     Risk factors: advanced age    Roster of Physicians Providing Medical Care to Patient:  See  "snapshot"   Activities of Daily Living: In your present state of health, do you have any difficulty performing the following activities?:  Preparing food and eating?: No  Bathing yourself: No  Getting dressed: No  Using the toilet:No  Moving around from place to place: No  In the past year have you fallen or had a near fall?:No    Home Safety: Has smoke detector and wears seat belts. No firearms.   Diet and Exercise  Current exercise habits: limited by health problems.   Dietary issues discussed: pt reports a healthy diet   Depression Screen  Q1: Over the past two weeks, have you felt down, depressed or hopeless? no  Q2: Over the past two weeks, have you felt little interest or pleasure in doing things? no   The following portions of the patient's history were reviewed and updated as appropriate: allergies, current medications, past family history, past medical history, past social history, past surgical history and problem list.   Review of Systems  Denies hearing loss, and visual loss Objective:   Vision:  Curator, but does not recall name.   Hearing: grossly normal Body mass index:  See vs page Msk: pt slowly performs "get-up-and-go" from a sitting position Cognitive Impairment Assessment: cognition, memory and judgment appear normal.  remembers 3/3 at 5 minutes.  excellent recall.  can easily read and write a sentence.  alert and oriented x 3   Assessment:   Medicare wellness utd on preventive parameters    Plan:   During the course of the visit the patient was educated and counseled about appropriate screening and preventive services including:        Fall prevention   Diabetes screening  Nutrition counseling   Vaccines / LABS Zostavax / Pneumococcal Vaccine today  PSA  Patient Instructions (the written plan) was given to the patient.

## 2016-07-27 NOTE — Patient Instructions (Addendum)
Please consider these measures for your health:  minimize alcohol.  Do not use tobacco products.  Have a colonoscopy at least every 10 years from age 83.  Keep firearms safely stored.  Always use seat belts.  have working smoke alarms in your home.  See an eye doctor and dentist regularly.  Never drive under the influence of alcohol or drugs (including prescription drugs).   please let me know what your wishes would be, if artificial life support measures should become necessary.  It is critically important to prevent falling down (keep floor areas well-lit, dry, and free of loose objects.  If you have a cane, walker, or wheelchair, you should use it, even for short trips around the house.  Wear flat-soled shoes.  Also, try not to rush).  I have sent a prescription to your pharmacy, for an antibiotic pill.  Loratadine-d (non-prescription) will help your congestion.  Please come back for a follow-up appointment in 6 months.

## 2016-07-30 ENCOUNTER — Telehealth: Payer: Self-pay | Admitting: Physician Assistant

## 2016-07-30 NOTE — Telephone Encounter (Signed)
Called to f/u on lab result, edema/symptoms, and his medicines.  Unable to leave a message at cell # left a message at the home #, asked to return my call to discuss his lab result and see how he was doing.  Francis Dowseenee Tersa Fotopoulos, PA-C

## 2016-08-02 ENCOUNTER — Telehealth: Payer: Self-pay | Admitting: Physician Assistant

## 2016-08-02 ENCOUNTER — Telehealth: Payer: Self-pay | Admitting: *Deleted

## 2016-08-02 ENCOUNTER — Other Ambulatory Visit: Payer: Self-pay | Admitting: Physician Assistant

## 2016-08-02 ENCOUNTER — Other Ambulatory Visit: Payer: Self-pay | Admitting: *Deleted

## 2016-08-02 DIAGNOSIS — E876 Hypokalemia: Secondary | ICD-10-CM

## 2016-08-02 MED ORDER — POTASSIUM CHLORIDE ER 10 MEQ PO TBCR: 10.0000 meq | EXTENDED_RELEASE_TABLET | Freq: Every day | ORAL | 0 refills | Status: DC

## 2016-08-02 NOTE — Telephone Encounter (Signed)
CALLED LMOV[facility] TO CALLBACK

## 2016-08-02 NOTE — Telephone Encounter (Signed)
LMVOM TO CONTACT OFFICE BACK ABOUT UPDATES ON EDEMA AND MEDICATIONS BEING TAKING AS REQUESTED 

## 2016-08-02 NOTE — Telephone Encounter (Signed)
New Message     Please call they are returning your call about Arath's blood work

## 2016-08-02 NOTE — Telephone Encounter (Signed)
SPOKE WITH Lucas [name] ABOUT Lucas [name] STATUS  AND  STATED PT DAILY WEIGHTS FOR WEIGHT GAIN AND BLOOD  PRESSURE HAVE BEEN STABLE WITH NO COMPLIANTS. HIS SWELLING HAS NOT  100% CAME DOWN BUT HAS SHOWN SOME PROGRESS OF GOING DOWN.   SHE SAYS HE'S CURRENTLY AND HAVE BEEN TAKING SPRINOLACTONE TWICE A DAY AS SUGGESTED. SHE IS AGREEABLE WITH NEW RX KDUR 10 MEQ ONCE A DAY   UNLESS HE HAS  REOCCURRING SYMPTOMS HE WILL STOP TAKING IT AND CONTACT CLINIC.THEY WILL RETURN FOR LAB WORK ON 08/10/2016.

## 2016-08-02 NOTE — Telephone Encounter (Signed)
potassium chloride (K-DUR) 10 MEQ tablet  Medication  Date: 08/02/2016 [facility] Office Ordering/Authorizing: Sheilah Pigeonenee Lynn Ursuy, [place]-C  Order Providers   Prescribing Provider Encounter Provider  [name] Norberto SorensonLynn Ursuy, PA-C Oleta MouseShana M Overton, CMA  Medication Detail    Disp Refills Start End   potassium chloride (K-DUR) 10 MEQ tablet 30 tablet 0 08/02/2016 10/31/2016   Sig - Route: Take 1 tablet (10 mEq total) by mouth daily. - Oral   E-Prescribing Status: Receipt confirmed by pharmacy (08/02/2016 10:58 AM EST)   Ph[facility]   [id number] - [place], [place] - [address] [facility] [address]

## 2016-08-02 NOTE — Telephone Encounter (Signed)
New message    Pt states she is returning your call - she states this was confidential information.

## 2016-08-02 NOTE — Telephone Encounter (Signed)
LMVOM TO CONTACT OFFICE BACK ABOUT UPDATES ON EDEMA AND MEDICATIONS BEING TAKING AS REQUESTED

## 2016-08-02 NOTE — Telephone Encounter (Signed)
-----   Message from [facility] Lynn Ursuy, [place]PA-C sent at 07/26/2016  3:58 PM EST ----- Please call the patient/wife, last I saw him, I increased his spironolactone to BID.  How is his edema, daily weights?  Is he taking it BID?  Please encourage potassium rich foods since he is certain he cannot (and will not) take K+   Thanks [employer]enee

## 2016-08-09 ENCOUNTER — Ambulatory Visit: Payer: Medicare Other | Admitting: Endocrinology

## 2016-08-10 ENCOUNTER — Other Ambulatory Visit: Payer: Medicare Other

## 2016-08-10 DIAGNOSIS — E876 Hypokalemia: Secondary | ICD-10-CM | POA: Diagnosis not present

## 2016-08-10 LAB — BASIC METABOLIC PANEL
BUN/Creatinine Ratio: 15 (ref 10–24)
BUN: 20 mg/dL (ref 8–27)
CO2: 27 mmol/L (ref 18–29)
CREATININE: 1.37 mg/dL — AB (ref 0.76–1.27)
Calcium: 9.4 mg/dL (ref 8.6–10.2)
Chloride: 100 mmol/L (ref 96–106)
GFR calc Af Amer: 56 mL/min/{1.73_m2} — ABNORMAL LOW (ref >59)
GFR calc non Af Amer: 48 mL/min/{1.73_m2} — ABNORMAL LOW (ref >59)
GLUCOSE: 89 mg/dL (ref 65–99)
Potassium: 4.1 mmol/L (ref 3.5–5.2)
SODIUM: 144 mmol/L (ref 134–144)

## 2016-08-16 NOTE — Progress Notes (Signed)
Cardiology Office Note Date:  08/16/2016  Patient ID:  Lucas [name], Lucas [name] 1934-02-13, MRN [id number] PCP:  Romero Belling, MD  Cardiologist:  Dr. Graciela Husbands (new 04/27/16)   Chief Complaint:  Medication concerns  History of Present Illness: BEAUX WEDEMEYER is a 83 y.o. male with history of HTN, HLD, TIA, PVD w/known carotid dz, was seen by Dr. Graciela Husbands 04/27/16 as a work-in new patient referred by the PMD for concerns of heart failure symptoms he was found in coarse AFib w/RVR and started on Coreg 6.25mg  BID, his furosemide increased to 80mg  daily, and started on Aldactone 25mg  daily given hx of hypokalemia.    He was seen in f/u by myself noting some confusion in his meds and had been off them for a couple days, he was still in RAFib, hypokalemic and his K+ was being replaced and he was resumed on his coreg and lasix noting his weight was up but not symptomatic.  A week laater 05/12/16 he was seen by Dicky Doe, PA and Dr. Clifton James wit persistent fluid OL, symptoms of CHF and admitted to the hospital.  Notes reviewed and she noting that intermittently he refused meds, insisted on going home only a couple days into his stay and planned for early f/u.  He saw Dr. Graciela Husbands 05/18/16 he was still in AF though improved rate, remained w/fluid OL and continued diuresis and planned for DCCV though when he arrived was in SR.  We have kept close f/u given his somewhat non-compliance with medicines (often self adjusting his medicines and significant hypokalemia.  Most recently aldactone was added, and then QOD Kdur (patient has reported intolerance of K+ with GI upset).  The patient comes today again accompanied by his wife, he is feeling well, denies any kind of CP, no palpitations, no rest or nighttime SOB, uses 2 pillows to sleep for years, but wakes every couple hours to urinate, he continues to feel like he hasn't got much energy, but not quite as winded.  No dizziness, near syncope or syncope.    He has  been compliant with his medicines according to the patient and wife, taking the K+ QOD and tolerating this.  He reports his home weight stable, with slow decrease, also reports excellent appetite and eating very well.  He feels there has been slow steady improvement in his LE swelling, able to get his shoes on again.  He denies any bleeding or signs of bleeding.   AFib Hx First noted Nov 2017 Amiodarone started Dec 2017 Discussed annual eye exams, he sees an eye doctor bi-annually 06/30/16 TSH 3.490, ALT 12, AST 19 Will need  PFTs once fluid status is stable  Past Medical History:  Diagnosis Date  . Acute CHF (congestive heart failure) (HCC) dx'd 04/2016  . Acute on chronic systolic and diastolic heart failure, NYHA class 3 (HCC) 05/12/2016  . ALLERGIC RHINITIS 01/25/2007  . Arthritis    "back, legs, shoulders, arms, qwhere" (05/12/2016)  . Atrial fibrillation with RVR (HCC)    [name] 05/12/2016  . Chronic lower back pain   . DM 07/24/2008  . 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 Gout   . HYPERCHOLESTEROLEMIA 06/19/2008  . HYPERTENSION 06/19/2008  . HYPOKALEMIA 07/24/2008  . Irregular heart beat   . 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 Pre-diabetes    "borderline" (05/12/2016)  . PVD (peripheral vascular disease) (HCC)    [name] 05/12/2016  . TIA 06/26/2008    Past Surgical History:  Procedure Laterality Date  . BACK SURGERY    . CATARACT EXTRACTION W/ INTRAOCULAR LENS IMPLANT Left   . INGUINAL HERNIA REPAIR Left   . KNEE ARTHROSCOPY Left   . LUMBAR DISC SURGERY  1980s   "got hurt on the job"  . TONSILLECTOMY      Current Outpatient Prescriptions  Medication Sig Dispense Refill  . acetaminophen (TYLENOL) 325 MG tablet Take 325 mg by mouth every 6 (six) hours as needed.    Marland Kitchen amiodarone (PACERONE) 200 MG tablet Take 1 tablet (200 mg total) by mouth daily. 90 tablet 3  . apixaban (ELIQUIS) 5 MG TABS tablet Take 1 tablet (5 mg total) by mouth 2  (two) times daily. 60 tablet 11  . carvedilol (COREG) 6.25 MG tablet Take 1 tablet (6.25 mg total) by mouth 2 (two) times daily. 60 tablet 3  . cephALEXin (KEFLEX) 250 MG capsule Take 1 capsule (250 mg total) by mouth 3 (three) times daily. 21 capsule 0  . furosemide (LASIX) 40 MG tablet Take 1 tablet (40 mg total) by mouth daily. 90 tablet 3  . magnesium hydroxide (MILK OF MAGNESIA) 400 MG/5ML suspension Take 30 mLs by mouth daily as needed for mild constipation or indigestion.    . potassium chloride (K-DUR) 10 MEQ tablet Take 1 tablet (10 mEq total) by mouth daily. 30 tablet 0  . spironolactone (ALDACTONE) 25 MG tablet Take 1 tablet (25 mg total) by mouth 2 (two) times daily. 180 tablet 1   No current facility-administered medications for this visit.     Allergies:   Sulfonamide derivatives   Social History:  The patient  reports that he has never smoked. He has never used smokeless tobacco. He reports that he does not drink alcohol or use drugs.   Family History:  The patient's family history includes Heart attack in his father; Stroke in his father; Uterine cancer in his sister.   ROS:  Please see the history of present illness.    All other systems are reviewed and otherwise negative.   PHYSICAL EXAM:  VS:  There were no vitals taken for this visit. BMI: There is no height or weight on file to calculate BMI. Well nourished, well developed, in no acute distress  HEENT: normocephalic, atraumatic  Neck: no JVD, carotid bruits or masses Cardiac: RRR; 1/6SM, no rubs, or gallops Lungs:  CTA b/l, no wheezing, rhonchi or rales  Abd: soft, nontender MS: no deformity or atrophy Ext:   2+ edema b/l LE to just above mid-shin (lower the last visit) Skin: warm and dry, no rash Neuro:  No gross deficits appreciated Psych: euthymic mood, full affect   EKG:  Done 07/19/16 SR, PAC's 05/27/16 SR/ectopic rhythm, PVCs 05/13/16: TTE Study Conclusions - Left ventricle: The cavity size was  moderately dilated. Systolic   function was mildly to moderately reduced. The estimated ejection   fraction was in the range of 40% to 45%. Wall motion was normal;   there were no regional wall motion abnormalities. The study was   not technically sufficient to allow evaluation of LV diastolic   dysfunction due to atrial fibrillation. Doppler parameters are   consistent with high ventricular filling pressure. - Aortic valve: Moderately calcified annulus. Trileaflet; normal   thickness, moderately calcified leaflets. Moderate focal   calcification involving the noncoronary cusp. - Mitral valve: Calcified annulus. There was mild regurgitation. - Left atrium: The atrium was moderately dilated. - Pulmonary arteries: PA peak pressure: 47 mm Hg (S). Impressions: -  The right ventricular systolic pressure was increased consistent   with moderate pulmonary hypertension.  2008 Vascular evaluation Dr. Darrick PennaFields  04/2007 Carotid US >80% stenosis L ICA, <40% right Recommended CEA but patient declined  Recent Labs: 05/12/2016: B Natriuretic Peptide 660.7; Hemoglobin 14.8; Platelets 165 06/11/2016: Pro B Natriuretic peptide (BNP) 637.0 06/30/2016: ALT 12; TSH 3.490 08/10/2016: BUN 20; Creatinine, Ser 1.37; Potassium 4.1; Sodium 144  No results found for requested labs within last 8760 hours.   CrCl cannot be calculated (Unknown ideal weight.).   Wt Readings from Last 3 Encounters:  07/27/16 195 lb (88.5 kg)  07/19/16 197 lb (89.4 kg)  06/16/16 184 lb (83.5 kg)     Other studies reviewed: Additional studies/records reviewed today include: summarized above  ASSESSMENT AND PLAN:  1. PAFib     CHA2DS2Vasc is at least 3 on Eliquis     Amiodarone is at 200mg  daily     Discussed avoiding stimulants      2. DCM, combined CHF, fluid OL     He remains overloaded with significant edema, though with some improvement     Weight is unchanged     His wife reports he drinks a significant amount of  water/juice/soda through the day     He is counseled on sodium/fluid intake restrictions  Today-Friday take lasix BID with K+ daily, same aldactone, Saturday resume lasix daily and take a K+ that day as well,  though instructed to take lasix in the morning rather then night in effofrt to improve his sleeping/minimize nighttime urination.   We discussed the importance of the K+ replacement and his propensity to get low.  Will get BMET on Monday     3. HTN     Stable, no changes today  4. Hypokalemia     finally improved to wnl     Encouraged K+ rich foods and medicine compliance  5. Known severe carotid disease from 2008     Pt then refused intervention, was recommended L CEA     No neuro symptoms      He has been encouraged follow up for this as well as statin thx     They report his PMD manages his cholesterol, he was on Simvastatin at some point but was stopped because of a "reaction", though uncertain what this was.      Disposition: 2-3 months, pending his response to adjustments today, sooner if needed, BMET Monday as noted.  Current medicines are reviewed at length with the patient today.  The patient did not have any concerns regarding medicines.  Judith BlonderSigned, [name] Ursy, PA-C 08/16/2016 6:17 PM     [facility] [address] [place] [address] [phone] (office)  [phone]) [phone] (fax)

## 2016-08-17 ENCOUNTER — Ambulatory Visit (INDEPENDENT_AMBULATORY_CARE_PROVIDER_SITE_OTHER): Payer: Medicare Other | Admitting: Physician Assistant

## 2016-08-17 VITALS — BP 116/64 | HR 72 | Ht 66.0 in | Wt 195.0 lb

## 2016-08-17 DIAGNOSIS — I10 Essential (primary) hypertension: Secondary | ICD-10-CM | POA: Diagnosis not present

## 2016-08-17 DIAGNOSIS — Z79899 Other long term (current) drug therapy: Secondary | ICD-10-CM | POA: Diagnosis not present

## 2016-08-17 DIAGNOSIS — I481 Persistent atrial fibrillation: Secondary | ICD-10-CM | POA: Diagnosis not present

## 2016-08-17 DIAGNOSIS — I5042 Chronic combined systolic (congestive) and diastolic (congestive) heart failure: Secondary | ICD-10-CM | POA: Diagnosis not present

## 2016-08-17 DIAGNOSIS — I4819 Other persistent atrial fibrillation: Secondary | ICD-10-CM

## 2016-08-17 MED ORDER — CARVEDILOL 6.25 MG PO TABS: 6.2500 mg | ORAL_TABLET | Freq: Two times a day (BID) | ORAL | 6 refills | Status: DC

## 2016-08-17 MED ORDER — POTASSIUM CHLORIDE ER 10 MEQ PO TBCR: 10.0000 meq | EXTENDED_RELEASE_TABLET | Freq: Every day | ORAL | 6 refills | Status: DC

## 2016-08-17 MED ORDER — SPIRONOLACTONE 25 MG PO TABS: 25.0000 mg | ORAL_TABLET | Freq: Two times a day (BID) | ORAL | 6 refills | Status: DC

## 2016-08-17 NOTE — Patient Instructions (Addendum)
Medication Instructions:   THIS WEEK 08-17-2016 : TUES. WED. THURSCaleen [place]. FRI. TAKE LASIX  (FUROSEMIDE) TWICE A DAY   START TAKING  POTASSIUM 10 MEQ EVERYDAY   08-21-2016 SAT.  START BACK TAKING LASIX  DAILY IN THE MORNING   If you need a refill on your cardiac medications before your next appointment, please call your pharmacy.  Labwork: RETURN   MONDAY FOR BMET    Testing/Procedures:NONE ORDERED  TODAY    Follow-Up: IN 2 TO 3 MONTHS WITH [name] URSUY    Any Other Special Instructions Will Be Listed Below (If Applicable).

## 2016-08-18 DIAGNOSIS — H25811 Combined forms of age-related cataract, right eye: Secondary | ICD-10-CM | POA: Diagnosis not present

## 2016-08-23 ENCOUNTER — Other Ambulatory Visit: Payer: Medicare Other | Admitting: *Deleted

## 2016-08-23 DIAGNOSIS — Z79899 Other long term (current) drug therapy: Secondary | ICD-10-CM

## 2016-08-24 LAB — BASIC METABOLIC PANEL
BUN/Creatinine Ratio: 14 (ref 10–24)
BUN: 17 mg/dL (ref 8–27)
CALCIUM: 9 mg/dL (ref 8.6–10.2)
CO2: 25 mmol/L (ref 18–29)
CREATININE: 1.2 mg/dL (ref 0.76–1.27)
Chloride: 100 mmol/L (ref 96–106)
GFR calc Af Amer: 66 mL/min/{1.73_m2} (ref >59)
GFR calc non Af Amer: 57 mL/min/{1.73_m2} — ABNORMAL LOW (ref >59)
GLUCOSE: 91 mg/dL (ref 65–99)
Potassium: 3.9 mmol/L (ref 3.5–5.2)
SODIUM: 143 mmol/L (ref 134–144)

## 2016-09-10 ENCOUNTER — Telehealth: Payer: Self-pay | Admitting: Internal Medicine

## 2016-09-10 NOTE — Telephone Encounter (Signed)
New Message:    Pt's wife wants to know if Dr Graciela Husbands received fax from pt's eye surgeon for surgical clearance?

## 2016-09-13 NOTE — Telephone Encounter (Signed)
I called and spoke with Mrs. Scherzinger and advised her I have not seen a form from the eye surgeon. She states the patient is pending cataract surgery on 09/23/16 with Dr. Sherryll Burger at [facility]. I advised Mrs. Selvidge I would call their office to follow up.  Call placed to [facility] [phone]. Per staff, the person I would need to speak to about the clearance letter is not available. I left a message for that person to call me or fax the surgical request.

## 2016-09-16 ENCOUNTER — Telehealth: Payer: Self-pay | Admitting: *Deleted

## 2016-09-16 NOTE — Telephone Encounter (Signed)
SPOKE WITH PATIENT WIFE MRS Dart ABOUT CALLING JUST TO UPDATE ON MR. Pry SYMPTOMS AND TAKING CURRENT MEDICATION CORRECTLY.  Pt  WIFE STATED THAT HE IS DOING WELL WITH NO COMPLAINTS OF SWELLING AND IS CURRENTLY TAKING ALL HIS CURRENT MEDICATIONS CORRECTLY ESPECIALY K+ DAILY.  PT WIFE MENTIONED ABOUT HER CURRENT APPT FOR 4*13*18 NEW  PT WITH DR HOCHREINTO SEE  IF POSSIBLE TO BE RESCHEDULED HERE AT [facility] LOCATION [facility] [place].  I INQUIRED ON OPENINGS HERE AT [facility] LOCATION WITH SCHEDULERS AND A SCHEDULER CONTACTED HER WITH AN OPTION.

## 2016-09-21 ENCOUNTER — Telehealth: Payer: Self-pay | Admitting: Internal Medicine

## 2016-09-21 NOTE — Telephone Encounter (Signed)
I left a message for Silvio Pate that the clearance was faxed back to her about 10 minutes ago. Confirmation received.

## 2016-09-21 NOTE — Telephone Encounter (Signed)
Clearance form faxed back to [facility] at [phone].  Confirmation received. I have notified the patient's wife of the above and that he may hold his eliquis the morning of the procedure only.  She verbalizes understanding.

## 2016-09-21 NOTE — Telephone Encounter (Signed)
New message    Silvio Pate at [facility] is calling, she states she is going to fax over a surgical clearance.   Request for surgical clearance:  1. What type of surgery is being performed? Cataract Surgery  2. When is this surgery scheduled? Thursday the 19th  3. Are there any medications that need to be held prior to surgery and how long? They are questioning the eliquis. They said it's up to the doctor   4. Name of physician performing surgery? Dr. Nelva Nay  5. What is your office phone and fax number? Phone-[phone] [phone]

## 2016-09-23 DIAGNOSIS — H25811 Combined forms of age-related cataract, right eye: Secondary | ICD-10-CM | POA: Diagnosis not present

## 2016-09-23 DIAGNOSIS — H2511 Age-related nuclear cataract, right eye: Secondary | ICD-10-CM | POA: Diagnosis not present

## 2016-10-20 ENCOUNTER — Other Ambulatory Visit: Payer: Self-pay | Admitting: *Deleted

## 2016-10-20 MED ORDER — AMIODARONE HCL 200 MG PO TABS: 200.0000 mg | ORAL_TABLET | Freq: Every day | ORAL | 2 refills | Status: DC

## 2016-10-26 ENCOUNTER — Ambulatory Visit (INDEPENDENT_AMBULATORY_CARE_PROVIDER_SITE_OTHER): Payer: Medicare Other | Admitting: Physician Assistant

## 2016-10-26 VITALS — BP 150/72 | HR 72 | Ht 66.0 in | Wt 188.0 lb

## 2016-10-26 DIAGNOSIS — I5042 Chronic combined systolic (congestive) and diastolic (congestive) heart failure: Secondary | ICD-10-CM | POA: Diagnosis not present

## 2016-10-26 DIAGNOSIS — I10 Essential (primary) hypertension: Secondary | ICD-10-CM | POA: Diagnosis not present

## 2016-10-26 DIAGNOSIS — I480 Paroxysmal atrial fibrillation: Secondary | ICD-10-CM

## 2016-10-26 DIAGNOSIS — I420 Dilated cardiomyopathy: Secondary | ICD-10-CM | POA: Diagnosis not present

## 2016-10-26 DIAGNOSIS — Z79899 Other long term (current) drug therapy: Secondary | ICD-10-CM | POA: Diagnosis not present

## 2016-10-26 NOTE — Progress Notes (Signed)
Cardiology Office Note Date:  10/26/2016  Patient ID:  [name], DOB 01/12/1934, MRN [id number] PCP:  Romero BellingEllison, [name], MD  Cardiologist:  Dr. Graciela HusbandsKlein (new 04/27/16)   Chief Complaint:  Medication concerns  History of Present Illness: Lucas [name] is a 83 y.o. male with history of HTN, HLD, TIA, PVD w/known carotid dz, was seen by Dr. Graciela HusbandsKlein 04/27/16 as a work-in new patient referred by the PMD for concerns of heart failure symptoms he was found in coarse AFib w/RVR and started on Coreg 6.25mg  BID, his furosemide increased to 80mg  daily, and started on Aldactone 25mg  daily given hx of hypokalemia.    He was seen in f/u by myself noting some confusion in his meds and had been off them for a couple days, he was still in RAFib, hypokalemic and his K+ was being replaced and he was resumed on his coreg and lasix noting his weight was up but not symptomatic.  A week laater 05/12/16 he was seen by [name], PA and [name] wit persistent fluid OL, symptoms of CHF and admitted to the hospital.  Notes reviewed and she noting that intermittently he refused meds, insisted on going home only a couple days into his stay and planned for early f/u.  He saw Dr. Graciela HusbandsKlein 05/18/16 he was still in AF though improved rate, remained w/fluid OL and continued diuresis and planned for DCCV though when he arrived was in SR.  We have kept close f/u given his somewhat non-compliance with medicines (often self adjusting his medicines and significant hypokalemia.  Most recently aldactone was added, and then QOD Kdur (patient has reported intolerance of K+ with GI upset).  Most recently was seen by myself in March, at that time his K+ had improved, his edema improving with further adjustment in his diuretics.  The patient comes today again accompanied by his wife, he is feeling very well!  He parked on the back side of the lot and was able to walk all the way in without stopping to catch his breath.  His home  weights have been stable 188-189lbs.  He denies any kind of CP, no palpitations, no rest or nighttime SOB, uses 2 pillows to sleep for years, but wakes every couple hours to urinate.  No dizziness, near syncope or syncope.  No bleeding or signs of bleeding.  He had his cataract surgery without complication.  AFib Hx First noted Nov 2017 Amiodarone started Dec 2017 Discussed annual eye exams, he sees an eye doctor bi-annually 06/30/16 TSH 3.490, ALT 12, AST 19 Will need  PFTs once fluid status is stable  Past Medical History:  Diagnosis Date  . Acute CHF (congestive heart failure) (HCC) dx'd 04/2016  . Acute on chronic systolic and diastolic heart failure, NYHA class 3 (HCC) 05/12/2016  . ALLERGIC RHINITIS 01/25/2007  . Arthritis    "back, legs, shoulders, arms, qwhere" (05/12/2016)  . Atrial fibrillation with RVR (HCC)    [name]/notes 05/12/2016  . Chronic lower back pain   . DM 07/24/2008  . 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 Gout   . HYPERCHOLESTEROLEMIA 06/19/2008  . HYPERTENSION 06/19/2008  . HYPOKALEMIA 07/24/2008  . Irregular heart beat   . 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 Pre-diabetes    "borderline" (05/12/2016)  . PVD (peripheral vascular disease) (HCC)    [name]/notes 05/12/2016  . TIA 06/26/2008    Past Surgical History:  Procedure Laterality Date  . BACK SURGERY    .  CATARACT EXTRACTION W/ INTRAOCULAR LENS IMPLANT Left   . INGUINAL HERNIA REPAIR Left   . KNEE ARTHROSCOPY Left   . LUMBAR DISC SURGERY  1980s   "got hurt on the job"  . TONSILLECTOMY      Current Outpatient Prescriptions  Medication Sig Dispense Refill  . acetaminophen (TYLENOL) 325 MG tablet Take 325 mg by mouth every 6 (six) hours as needed.    Marland Kitchen amiodarone (PACERONE) 200 MG tablet Take 1 tablet (200 mg total) by mouth daily. 90 tablet 2  . apixaban (ELIQUIS) 5 MG TABS tablet Take 1 tablet (5 mg total) by mouth 2 (two) times daily. 60 tablet 11  . carvedilol (COREG) 6.25 MG  tablet Take 1 tablet (6.25 mg total) by mouth 2 (two) times daily. 60 tablet 6  . cephALEXin (KEFLEX) 250 MG capsule Take 1 capsule (250 mg total) by mouth 3 (three) times daily. 21 capsule 0  . magnesium hydroxide (MILK OF MAGNESIA) 400 MG/5ML suspension Take 30 mLs by mouth daily as needed for mild constipation or indigestion.    . potassium chloride (K-DUR) 10 MEQ tablet Take 1 tablet (10 mEq total) by mouth daily. 30 tablet 6  . spironolactone (ALDACTONE) 25 MG tablet Take 1 tablet (25 mg total) by mouth 2 (two) times daily. 60 tablet 6  . furosemide (LASIX) 40 MG tablet Take 1 tablet (40 mg total) by mouth daily. 90 tablet 3   No current facility-administered medications for this visit.     Allergies:   Sulfonamide derivatives   Social History:  The patient  reports that he has never smoked. He has never used smokeless tobacco. He reports that he does not drink alcohol or use drugs.   Family History:  The patient's family history includes Heart attack in his father; Stroke in his father; Uterine cancer in his sister.   ROS:  Please see the history of present illness.    All other systems are reviewed and otherwise negative.   PHYSICAL EXAM:  VS:  BP (!) 150/72   Pulse 72   Ht 5\' 6"  (1.676 m)   Wt 188 lb (85.3 kg)   BMI 30.34 kg/m  BMI: Body mass index is 30.34 kg/m. Well nourished, well developed, in no acute distress  HEENT: normocephalic, atraumatic  Neck: no JVD, carotid bruits or masses Cardiac: RRR; 1/6SM, no rubs, or gallops Lungs:  CTA b/l, no wheezing, rhonchi or rales  Abd: soft, nontender MS: no deformity or atrophy Ext:   Trace edema b/l LE Skin: warm and dry, no rash Neuro:  No gross deficits appreciated Psych: euthymic mood, full affect   EKG:  Done 07/19/16 SR, PAC's 05/27/16 SR/ectopic rhythm, PVCs 05/13/16: TTE Study Conclusions - Left ventricle: The cavity size was moderately dilated. Systolic   function was mildly to moderately reduced. The estimated  ejection   fraction was in the range of 40% to 45%. Wall motion was normal;   there were no regional wall motion abnormalities. The study was   not technically sufficient to allow evaluation of LV diastolic   dysfunction due to atrial fibrillation. Doppler parameters are   consistent with high ventricular filling pressure. - Aortic valve: Moderately calcified annulus. Trileaflet; normal   thickness, moderately calcified leaflets. Moderate focal   calcification involving the noncoronary cusp. - Mitral valve: Calcified annulus. There was mild regurgitation. - Left atrium: The atrium was moderately dilated. - Pulmonary arteries: PA peak pressure: 47 mm Hg (S). Impressions: - The right ventricular systolic pressure  was increased consistent   with moderate pulmonary hypertension.  2008 Vascular evaluation Dr. Darrick Penna  04/2007 Carotid US >80% stenosis L ICA, <40% right Recommended CEA but patient declined  Recent Labs: 05/12/2016: B Natriuretic Peptide 660.7; Hemoglobin 14.8; Platelets 165 06/11/2016: Pro B Natriuretic peptide (BNP) 637.0 06/30/2016: ALT 12; TSH 3.490 08/23/2016: BUN 17; Creatinine, Ser 1.20; Potassium 3.9; Sodium 143  No results found for requested labs within last 8760 hours.   CrCl cannot be calculated (Patient's most recent lab result is older than the maximum 21 days allowed.).   Wt Readings from Last 3 Encounters:  10/26/16 188 lb (85.3 kg)  08/17/16 195 lb (88.5 kg)  07/27/16 195 lb (88.5 kg)     Other studies reviewed: Additional studies/records reviewed today include: summarized above  ASSESSMENT AND PLAN:  1. Paroxysmal AFib     CHA2DS2Vasc is at least 3 on Eliquis     Amiodarone is at 200mg  daily     Almost 6 months since last labs, will get BMET/CBC, TSH, LFTs done and send for PFTs      2. DCM, chronic combined CHF   Exam is much better, only trace if any edema at this time, exam appears euvolemic   He is reminded on sodium/fluid intake restrictions    We also discussed signs of dehydration and fluid intake balance     3. HTN     no changes today  4. Hypokalemia     Remained improved on last labs      5. Known severe carotid disease from 2008     Pt then refused intervention, was recommended L CEA     No neuro symptoms     He has been encouraged follow up for this as well as statin tx     They report his PMD manages his cholesterol, he was on Simvastatin at some point but was stopped because of a "reaction", though uncertain what this was.  The patient and wife state he sees his PMD a month from today, he prefers to have this f/u with his PMD, I have asked CMA to write a reminder on his paperwork from today to discuss this at his visit next month      Disposition: 4 months, sooner if needed.  Current medicines are reviewed at length with the patient today.  The patient did not have any concerns regarding medicines.   Judith Blonder, PA-C 10/26/2016 2:50 PM     [facility] [address] [place] [address] [phone] (office)  [phone] (fax)

## 2016-10-26 NOTE — Patient Instructions (Addendum)
Medication Instructions:   Your physician recommends that you continue on your current medications as directed. Please refer to the Current Medication list given to you today.   If you need a refill on your cardiac medications before your next appointment, please call your pharmacy.  Labwork: BMET CBC TSH AND LFT TOMMORROW   Testing/Procedures:  Your physician has recommended that you have a pulmonary function test. Pulmonary Function Tests are a group of tests that measure how well air moves in and out of your lungs.     Follow-Up: IN 4 MONTHS WITH [name]     Any Other Special Instructions Will Be Listed Below (If Applicable).

## 2016-10-27 ENCOUNTER — Other Ambulatory Visit: Payer: Medicare Other | Admitting: *Deleted

## 2016-10-27 DIAGNOSIS — I480 Paroxysmal atrial fibrillation: Secondary | ICD-10-CM | POA: Diagnosis not present

## 2016-10-28 LAB — HEPATIC FUNCTION PANEL
ALBUMIN: 3.8 g/dL (ref 3.5–4.7)
ALK PHOS: 107 IU/L (ref 39–117)
ALT: 17 IU/L (ref 0–44)
AST: 22 IU/L (ref 0–40)
BILIRUBIN TOTAL: 0.6 mg/dL (ref 0.0–1.2)
Bilirubin, Direct: 0.23 mg/dL (ref 0.00–0.40)
Total Protein: 6.8 g/dL (ref 6.0–8.5)

## 2016-10-28 LAB — TSH: TSH: 1.83 u[IU]/mL (ref 0.450–4.500)

## 2016-10-28 LAB — CBC
HEMATOCRIT: 43.9 % (ref 37.5–51.0)
Hemoglobin: 14.1 g/dL (ref 13.0–17.7)
MCH: 29 pg (ref 26.6–33.0)
MCHC: 32.1 g/dL (ref 31.5–35.7)
MCV: 90 fL (ref 79–97)
Platelets: 158 10*3/uL (ref 150–379)
RBC: 4.87 x10E6/uL (ref 4.14–5.80)
RDW: 15.1 % (ref 12.3–15.4)
WBC: 6.7 10*3/uL (ref 3.4–10.8)

## 2016-10-28 LAB — BASIC METABOLIC PANEL
BUN / CREAT RATIO: 12 (ref 10–24)
BUN: 16 mg/dL (ref 8–27)
CO2: 26 mmol/L (ref 18–29)
CREATININE: 1.33 mg/dL — AB (ref 0.76–1.27)
Calcium: 9.2 mg/dL (ref 8.6–10.2)
Chloride: 101 mmol/L (ref 96–106)
GFR calc Af Amer: 58 mL/min/{1.73_m2} — ABNORMAL LOW (ref >59)
GFR calc non Af Amer: 50 mL/min/{1.73_m2} — ABNORMAL LOW (ref >59)
GLUCOSE: 69 mg/dL (ref 65–99)
Potassium: 3.6 mmol/L (ref 3.5–5.2)
Sodium: 142 mmol/L (ref 134–144)

## 2016-11-03 ENCOUNTER — Ambulatory Visit (INDEPENDENT_AMBULATORY_CARE_PROVIDER_SITE_OTHER): Payer: Medicare Other | Admitting: Internal Medicine

## 2016-11-03 DIAGNOSIS — Z79899 Other long term (current) drug therapy: Secondary | ICD-10-CM | POA: Diagnosis not present

## 2016-11-03 LAB — PULMONARY FUNCTION TEST
DL/VA % PRED: 62 %
DL/VA: 2.68 ml/min/mmHg/L
DLCO COR % PRED: 42 %
DLCO COR: 11.01 ml/min/mmHg
DLCO unc % pred: 43 %
DLCO unc: 11.34 ml/min/mmHg
FEF 25-75 Post: 1.48 L/sec
FEF 25-75 Pre: 1.61 L/sec
FEF2575-%CHANGE-POST: -7 %
FEF2575-%PRED-PRE: 101 %
FEF2575-%Pred-Post: 93 %
FEV1-%Change-Post: -1 %
FEV1-%PRED-PRE: 98 %
FEV1-%Pred-Post: 97 %
FEV1-Post: 2.04 L
FEV1-Pre: 2.07 L
FEV1FVC-%CHANGE-POST: 0 %
FEV1FVC-%Pred-Pre: 101 %
FEV6-%Change-Post: 0 %
FEV6-%PRED-PRE: 99 %
FEV6-%Pred-Post: 99 %
FEV6-PRE: 2.73 L
FEV6-Post: 2.72 L
FEV6FVC-%Change-Post: 0 %
FEV6FVC-%Pred-Post: 107 %
FEV6FVC-%Pred-Pre: 106 %
FVC-%Change-Post: 0 %
FVC-%PRED-POST: 92 %
FVC-%Pred-Pre: 93 %
FVC-PRE: 2.75 L
FVC-Post: 2.72 L
POST FEV6/FVC RATIO: 100 %
PRE FEV1/FVC RATIO: 75 %
Post FEV1/FVC ratio: 75 %
Pre FEV6/FVC Ratio: 99 %
RV % pred: 177 %
RV: 4.3 L
TLC % pred: 113 %
TLC: 6.94 L

## 2016-11-03 NOTE — Progress Notes (Signed)
PFT done today. 

## 2017-01-11 DIAGNOSIS — H26492 Other secondary cataract, left eye: Secondary | ICD-10-CM | POA: Diagnosis not present

## 2017-01-24 ENCOUNTER — Ambulatory Visit: Payer: Medicare Other | Admitting: Endocrinology

## 2017-01-28 ENCOUNTER — Ambulatory Visit: Payer: Medicare Other | Admitting: Endocrinology

## 2017-01-31 ENCOUNTER — Ambulatory Visit: Payer: Medicare Other | Admitting: Endocrinology

## 2017-01-31 ENCOUNTER — Telehealth: Payer: Self-pay | Admitting: Endocrinology

## 2017-01-31 NOTE — Telephone Encounter (Signed)
Wife cancelled appointment due to family emergency. Would like to reschedule the 2nd week of September and there are no appointments available. Please call.

## 2017-02-01 ENCOUNTER — Ambulatory Visit: Payer: Medicare Other | Admitting: Endocrinology

## 2017-02-19 NOTE — Progress Notes (Signed)
Cardiology Office Note Date:  02/19/2017  Patient ID:  Lucas [name], Lucas [name] 26-Jan-1934, MRN [id number] PCP:  Romero Belling, MD  Cardiologist:  Dr. Graciela Husbands (new 04/27/16)   Chief Complaint:  Medication concerns  History of Present Illness: Lucas [name] is a 83 y.o. male with history of HTN, HLD, TIA, PVD w/known carotid dz, was seen by Dr. Graciela Husbands 04/27/16 as a work-in new patient referred by the PMD for concerns of heart failure symptoms he was found in coarse AFib w/RVR and started on Coreg 6.25mg  BID, his furosemide increased to  daily, and started on Aldactone  daily given hx of hypokalemia.    He was seen in f/u by myself noting some confusion in his meds and had been off them for a couple days, he was still in RAFib, hypokalemic and his K+ was being replaced and he was resumed on his coreg and lasix noting his weight was up but not symptomatic.  A week laater 05/12/16 he was seen by Dicky Doe, PA and Dr. Clifton James wit persistent fluid OL, symptoms of CHF and admitted to the hospital.  Notes reviewed and she noting that intermittently he refused meds, insisted on going home only a couple days into his stay and planned for early f/u.  He saw Dr. Graciela Husbands 05/18/16 he was still in AF though improved rate, remained w/fluid OL and continued diuresis and planned for DCCV though when he arrived was in SR.  We have kept close f/u given his somewhat non-compliance with medicines (often self adjusting his medicines and significant hypokalemia.  Most recently aldactone was added, and then QOD Kdur (patient has reported intolerance of K+ with GI upset).  Most recently was seen by myself in March, at that time his K+ had improved, his edema improving with further adjustment in his diuretics.  The was last seen by myself in May, he was accompanied by his wife, he was feeling very well!  He parked on the back side of the lot and was able to walk all the way in without stopping to catch his breath.   His home weights had been stable 188-189lbs.  He denied any kind of CP, no palpitations, no rest or nighttime SOB, uses 2 pillows to sleep for years, but wakes every couple hours to urinate.  No c/o dizziness, near syncope or syncope.  No reported bleeding or signs of bleeding.     He is again accompanied by his wife.  He never did see his PMD or f/u with pulmonary referral.  He denies any kind of pulmonary symptoms.  No SOB, DOE, denies symptoms of PND or orthopnea, says the only thing keeping him up at night is the bathroom.  He denies any CP, palpitations, no dizziness, near syncope or syncope.  His weight is up, he does not feel bloated or like he is retaining water, says he has been eating very well.  AFib Hx First noted Nov 2017 Amiodarone started Dec 2017 Discussed annual eye exams, he sees an eye doctor bi-annually May TSH, LFts OK May 2018 abnormal PFTs referred to pulmonary via PMD though patient did not go  Past Medical History:  Diagnosis Date  . Acute CHF (congestive heart failure) (HCC) dx'd 04/2016  . Acute on chronic systolic and diastolic heart failure, NYHA class 3 (HCC) 05/12/2016  . ALLERGIC RHINITIS 01/25/2007  . Arthritis    "back, legs, shoulders, arms, qwhere" (05/12/2016)  . Atrial fibrillation with RVR (HCC)    [name] 05/12/2016  .  Chronic lower back pain   . DM 07/24/2008  . 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 Gout   . HYPERCHOLESTEROLEMIA 06/19/2008  . HYPERTENSION 06/19/2008  . HYPOKALEMIA 07/24/2008  . Irregular heart beat   . 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 Pre-diabetes    "borderline" (05/12/2016)  . PVD (peripheral vascular disease) (HCC)    [name] 05/12/2016  . TIA 06/26/2008    Past Surgical History:  Procedure Laterality Date  . BACK SURGERY    . CATARACT EXTRACTION W/ INTRAOCULAR LENS IMPLANT Left   . INGUINAL HERNIA REPAIR Left   . KNEE ARTHROSCOPY Left   . LUMBAR DISC SURGERY  1980s   "got hurt on the job"    . TONSILLECTOMY      Current Outpatient Prescriptions  Medication Sig Dispense Refill  . acetaminophen (TYLENOL) 325 MG tablet Take 325 mg by mouth every 6 (six) hours as needed.    Marland Kitchen amiodarone (PACERONE) 200 MG tablet Take 1 tablet (200 mg total) by mouth daily. 90 tablet 2  . apixaban (ELIQUIS) 5 MG TABS tablet Take 1 tablet (5 mg total) by mouth 2 (two) times daily. 60 tablet 11  . carvedilol (COREG) 6.25 MG tablet Take 1 tablet (6.25 mg total) by mouth 2 (two) times daily. 60 tablet 6  . cephALEXin (KEFLEX) 250 MG capsule Take 1 capsule (250 mg total) by mouth 3 (three) times daily. 21 capsule 0  . furosemide (LASIX) 40 MG tablet Take 1 tablet (40 mg total) by mouth daily. 90 tablet 3  . magnesium hydroxide (MILK OF MAGNESIA) 400 MG/5ML suspension Take 30 mLs by mouth daily as needed for mild constipation or indigestion.    . potassium chloride (K-DUR) 10 MEQ tablet Take 1 tablet (10 mEq total) by mouth daily. 30 tablet 6  . spironolactone (ALDACTONE) 25 MG tablet Take 1 tablet (25 mg total) by mouth 2 (two) times daily. 60 tablet 6   No current facility-administered medications for this visit.     Allergies:   Sulfonamide derivatives   Social History:  The patient  reports that he has never smoked. He has never used smokeless tobacco. He reports that he does not drink alcohol or use drugs.   Family History:  The patient's family history includes Heart attack in his father; Stroke in his father; Uterine cancer in his sister.   ROS:  Please see the history of present illness.    All other systems are reviewed and otherwise negative.   PHYSICAL EXAM:  VS:  There were no vitals taken for this visit. BMI: There is no height or weight on file to calculate BMI. Well nourished, well developed, in no acute distress  HEENT: normocephalic, atraumatic  Neck: no JVD, carotid bruits or masses Cardiac: RRR; 1/6SM, no rubs, or gallops Lungs:  CTA b/l, no wheezing, rhonchi or rales  Abd:  soft, nontender MS: no deformity, age appropriate atrophy Ext:   trace edema b/l LE Skin: warm and dry, no rash Neuro:  No gross deficits appreciated Psych: euthymic mood, full affect   EKG:  Done 07/19/16 SR, PAC's 05/27/16 SR/ectopic rhythm, PVCs 05/13/16: TTE Study Conclusions - Left ventricle: The cavity size was moderately dilated. Systolic   function was mildly to moderately reduced. The estimated ejection   fraction was in the range of 40% to 45%. Wall motion was normal;   there were no regional wall motion abnormalities. The study was   not technically sufficient to allow evaluation of LV  diastolic   dysfunction due to atrial fibrillation. Doppler parameters are   consistent with high ventricular filling pressure. - Aortic valve: Moderately calcified annulus. Trileaflet; normal   thickness, moderately calcified leaflets. Moderate focal   calcification involving the noncoronary cusp. - Mitral valve: Calcified annulus. There was mild regurgitation. - Left atrium: The atrium was moderately dilated. - Pulmonary arteries: PA peak pressure: 47 mm Hg (S). Impressions: - The right ventricular systolic pressure was increased consistent   with moderate pulmonary hypertension.  2008 Vascular evaluation Dr. Darrick Penna  04/2007 Carotid US >80% stenosis L ICA, <40% right Recommended CEA but patient declined  Recent Labs: 05/12/2016: B Natriuretic Peptide 660.7 06/11/2016: Pro B Natriuretic peptide (BNP) 637.0 10/27/2016: ALT 17; BUN 16; Creatinine, Ser 1.33; Hemoglobin 14.1; Platelets 158; Potassium 3.6; Sodium 142; TSH 1.830  No results found for requested labs within last 8760 hours.   CrCl cannot be calculated (Patient's most recent lab result is older than the maximum 21 days allowed.).   Wt Readings from Last 3 Encounters:  10/26/16 188 lb (85.3 kg)  08/17/16 195 lb (88.5 kg)  07/27/16 195 lb (88.5 kg)     Other studies reviewed: Additional studies/records reviewed today include:  summarized above  ASSESSMENT AND PLAN:  1. Paroxysmal AFib     CHA2DS2Vasc is at least 3 on Eliquis     Amiodarone is at  daily     Patient and wife do not recall being recommended to see PMD about pulm eval.  He has no pulmonary symptoms, will refer to pulmonary       2. DCM, chronic combined CHF      Trace edema only, exam appears euvolemic      He is reminded on sodium/fluid intake restrictions     BMET today     3. HTN      no changes today  4. Hypokalemia     Historically, will fllow      5. Known severe carotid disease from 2008     Pt then refused intervention, was recommended L CEA     No neuro symptoms     He has been encouraged follow up for this as well as statin tx     They report his PMD manages his cholesterol, he was on Simvastatin at some point but was stopped because of a "reaction", though uncertain what this was.  The patient today tells me he does not want to see a vascular specialist, worried about surgical complications if he should require surgical intervention, discussed potential CVA/neurological complications of carotid disease, he remains unagreeable      Disposition: As above,  Will have close f/u to try and ensure pulmonary f/u  Current medicines are reviewed at length with the patient today.  The patient did not have any concerns regarding medicines.   Judith Blonder, PA-C 02/19/2017 3:53 PM     [facility] [address] [place] [address] [phone] (office)  [phone] (fax)

## 2017-02-22 ENCOUNTER — Encounter (INDEPENDENT_AMBULATORY_CARE_PROVIDER_SITE_OTHER): Payer: Self-pay

## 2017-02-22 ENCOUNTER — Ambulatory Visit (INDEPENDENT_AMBULATORY_CARE_PROVIDER_SITE_OTHER): Payer: Medicare Other | Admitting: Physician Assistant

## 2017-02-22 VITALS — BP 136/74 | HR 74 | Ht 66.0 in | Wt 203.0 lb

## 2017-02-22 DIAGNOSIS — I739 Peripheral vascular disease, unspecified: Secondary | ICD-10-CM | POA: Diagnosis not present

## 2017-02-22 DIAGNOSIS — I10 Essential (primary) hypertension: Secondary | ICD-10-CM

## 2017-02-22 DIAGNOSIS — Z79899 Other long term (current) drug therapy: Secondary | ICD-10-CM | POA: Diagnosis not present

## 2017-02-22 DIAGNOSIS — R942 Abnormal results of pulmonary function studies: Secondary | ICD-10-CM | POA: Diagnosis not present

## 2017-02-22 DIAGNOSIS — I480 Paroxysmal atrial fibrillation: Secondary | ICD-10-CM

## 2017-02-22 DIAGNOSIS — I5043 Acute on chronic combined systolic (congestive) and diastolic (congestive) heart failure: Secondary | ICD-10-CM

## 2017-02-22 NOTE — Patient Instructions (Addendum)
Medication Instructions:   Your physician recommends that you continue on your current medications as directed. Please refer to the Current Medication list given to you today.   If you need a refill on your cardiac medications before your next appointment, please call your pharmacy.  Labwork:  BMET TODAY    Testing/Procedures: NONE ORDERED  TODAY    Follow-Up:  IN 6 WEEKS WITH URSUY OR RECALL   You have been referred to TO SEE AN PULMONOLOGIST DR. SOOD  FOR YOUR SYMPTOMS     Any Other Special Instructions Will Be Listed Below (If Applicable).

## 2017-02-23 LAB — BASIC METABOLIC PANEL
BUN/Creatinine Ratio: 11 (ref 10–24)
BUN: 16 mg/dL (ref 8–27)
CHLORIDE: 103 mmol/L (ref 96–106)
CO2: 23 mmol/L (ref 20–29)
CREATININE: 1.45 mg/dL — AB (ref 0.76–1.27)
Calcium: 9.1 mg/dL (ref 8.6–10.2)
GFR calc Af Amer: 52 mL/min/{1.73_m2} — ABNORMAL LOW (ref >59)
GFR calc non Af Amer: 45 mL/min/{1.73_m2} — ABNORMAL LOW (ref >59)
GLUCOSE: 98 mg/dL (ref 65–99)
POTASSIUM: 4 mmol/L (ref 3.5–5.2)
SODIUM: 144 mmol/L (ref 134–144)

## 2017-02-25 ENCOUNTER — Institutional Professional Consult (permissible substitution): Payer: Medicare Other | Admitting: Internal Medicine

## 2017-03-02 ENCOUNTER — Other Ambulatory Visit: Payer: Self-pay

## 2017-03-02 ENCOUNTER — Ambulatory Visit (INDEPENDENT_AMBULATORY_CARE_PROVIDER_SITE_OTHER): Payer: Medicare Other | Admitting: Endocrinology

## 2017-03-02 ENCOUNTER — Encounter: Payer: Self-pay | Admitting: Endocrinology

## 2017-03-02 VITALS — BP 136/80 | HR 67 | Wt 201.8 lb

## 2017-03-02 DIAGNOSIS — Z23 Encounter for immunization: Secondary | ICD-10-CM

## 2017-03-02 DIAGNOSIS — E119 Type 2 diabetes mellitus without complications: Secondary | ICD-10-CM

## 2017-03-02 LAB — POCT GLYCOSYLATED HEMOGLOBIN (HGB A1C): HEMOGLOBIN A1C: 5.7

## 2017-03-02 MED ORDER — DOXYCYCLINE HYCLATE 100 MG PO TABS: 100.0000 mg | ORAL_TABLET | Freq: Two times a day (BID) | ORAL | 0 refills | Status: DC

## 2017-03-02 NOTE — Progress Notes (Signed)
Subjective:    Patient ID: Lucas [name], male    DOB: 1934-04-29, 83 y.o.   MRN: [id number]  HPI Pt states few weeks of moderate pain at the bilat maxillary areas, but no assoc earache.   Past Medical History:  Diagnosis Date  . Acute CHF (congestive heart failure) (HCC) dx'd 04/2016  . Acute on chronic systolic and diastolic heart failure, NYHA class 3 (HCC) 05/12/2016  . ALLERGIC RHINITIS 01/25/2007  . Arthritis    "back, legs, shoulders, arms, qwhere" (05/12/2016)  . Atrial fibrillation with RVR (HCC)    [name] 05/12/2016  . Chronic lower back pain   . DM 07/24/2008  . 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 Gout   . HYPERCHOLESTEROLEMIA 06/19/2008  . HYPERTENSION 06/19/2008  . HYPOKALEMIA 07/24/2008  . Irregular heart beat   . 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 Pre-diabetes    "borderline" (05/12/2016)  . PVD (peripheral vascular disease) (HCC)    [name] 05/12/2016  . TIA 06/26/2008    Past Surgical History:  Procedure Laterality Date  . BACK SURGERY    . CATARACT EXTRACTION W/ INTRAOCULAR LENS IMPLANT Left   . INGUINAL HERNIA REPAIR Left   . KNEE ARTHROSCOPY Left   . LUMBAR DISC SURGERY  1980s   "got hurt on the job"  . TONSILLECTOMY      Social History   Social History  . Marital status: Married    Spouse name: N/A  . Number of children: N/A  . Years of education: N/A   Occupational History  . Not on file.   Social History Main Topics  . Smoking status: Never Smoker  . Smokeless tobacco: Never Used  . Alcohol use No  . Drug use: No  . Sexual activity: Not on file   Other Topics Concern  . Not on file   Social History Narrative  . No narrative on file    Current Outpatient Prescriptions on File Prior to Visit  Medication Sig Dispense Refill  . acetaminophen (TYLENOL) 325 MG tablet Take 325 mg by mouth every 6 (six) hours as needed.    Marland Kitchen amiodarone (PACERONE) 200 MG tablet Take 1 tablet (200 mg total) by mouth daily.  90 tablet 2  . apixaban (ELIQUIS) 5 MG TABS tablet Take 1 tablet (5 mg total) by mouth 2 (two) times daily. 60 tablet 11  . carvedilol (COREG) 6.25 MG tablet Take 1 tablet (6.25 mg total) by mouth 2 (two) times daily. 60 tablet 6  . furosemide (LASIX) 40 MG tablet Take 1 tablet (40 mg total) by mouth daily. 90 tablet 3  . magnesium hydroxide (MILK OF MAGNESIA) 400 MG/5ML suspension Take 30 mLs by mouth daily as needed for mild constipation or indigestion.    . potassium chloride (K-DUR) 10 MEQ tablet Take 1 tablet (10 mEq total) by mouth daily. 30 tablet 6  . spironolactone (ALDACTONE) 25 MG tablet Take 1 tablet (25 mg total) by mouth 2 (two) times daily. 60 tablet 6   No current facility-administered medications on file prior to visit.     Allergies  Allergen Reactions  . Sulfonamide Derivatives Nausea And Vomiting    Family History  Problem Relation Age of Onset  . Stroke Father   . Heart attack Father   . Uterine cancer Sister     BP 136/80   Pulse 67   Wt 201 lb 12.8 oz (91.5 kg)   SpO2 95%   BMI 32.57 kg/m  Review of Systems Denies fever and dysuria    Objective:   Physical Exam VITAL SIGNS:  See vs page GENERAL: no distress head: no deformity  eyes: no periorbital swelling, no proptosis  external nose and ears are normal  mouth: no lesion seen Both eac's and tm's are normal.   Lab Results  Component Value Date   HGBA1C 5.7 03/02/2017   Lab Results  Component Value Date   TSH 1.830 10/27/2016      Assessment & Plan:  URI: new Renal insuff: recheck today Hyperglycemia: stable.   Patient Instructions  I have sent a prescription to your pharmacy, for the antibiotic pill Please continue the same other medications. Please come back for a follow-up appointment in 6 months.

## 2017-03-02 NOTE — Patient Instructions (Signed)
I have sent a prescription to your pharmacy, for the antibiotic pill Please continue the same other medications. Please come back for a follow-up appointment in 6 months.

## 2017-03-23 ENCOUNTER — Other Ambulatory Visit (INDEPENDENT_AMBULATORY_CARE_PROVIDER_SITE_OTHER): Payer: Medicare Other

## 2017-03-23 ENCOUNTER — Encounter: Payer: Self-pay | Admitting: Internal Medicine

## 2017-03-23 ENCOUNTER — Ambulatory Visit (INDEPENDENT_AMBULATORY_CARE_PROVIDER_SITE_OTHER)
Admission: RE | Admit: 2017-03-23 | Discharge: 2017-03-23 | Disposition: A | Discharge status: Home / Self Care | Payer: Medicare Other | Source: Ambulatory Visit | Attending: Internal Medicine | Admitting: Internal Medicine

## 2017-03-23 ENCOUNTER — Ambulatory Visit (INDEPENDENT_AMBULATORY_CARE_PROVIDER_SITE_OTHER): Payer: Medicare Other | Admitting: Internal Medicine

## 2017-03-23 VITALS — BP 114/80 | HR 67 | Ht 66.0 in | Wt 200.0 lb

## 2017-03-23 DIAGNOSIS — R0609 Other forms of dyspnea: Secondary | ICD-10-CM

## 2017-03-23 DIAGNOSIS — R05 Cough: Secondary | ICD-10-CM | POA: Diagnosis not present

## 2017-03-23 DIAGNOSIS — R058 Other specified cough: Secondary | ICD-10-CM

## 2017-03-23 DIAGNOSIS — R0602 Shortness of breath: Secondary | ICD-10-CM | POA: Diagnosis not present

## 2017-03-23 LAB — CBC WITH DIFFERENTIAL/PLATELET
Basophils Absolute: 0.1 10*3/uL (ref 0.0–0.1)
Basophils Relative: 1.1 % (ref 0.0–3.0)
EOS PCT: 1.9 % (ref 0.0–5.0)
Eosinophils Absolute: 0.1 10*3/uL (ref 0.0–0.7)
HCT: 46.1 % (ref 39.0–52.0)
HEMOGLOBIN: 14.9 g/dL (ref 13.0–17.0)
LYMPHS ABS: 1.8 10*3/uL (ref 0.7–4.0)
Lymphocytes Relative: 34.2 % (ref 12.0–46.0)
MCHC: 32.3 g/dL (ref 30.0–36.0)
MCV: 88.8 fl (ref 78.0–100.0)
MONOS PCT: 10.9 % (ref 3.0–12.0)
Monocytes Absolute: 0.6 10*3/uL (ref 0.1–1.0)
NEUTROS PCT: 51.9 % (ref 43.0–77.0)
Neutro Abs: 2.7 10*3/uL (ref 1.4–7.7)
Platelets: 172 10*3/uL (ref 150.0–400.0)
RBC: 5.2 Mil/uL (ref 4.22–5.81)
RDW: 15.3 % (ref 11.5–15.5)
WBC: 5.1 10*3/uL (ref 4.0–10.5)

## 2017-03-23 LAB — BASIC METABOLIC PANEL
BUN: 18 mg/dL (ref 6–23)
CHLORIDE: 103 meq/L (ref 96–112)
CO2: 24 meq/L (ref 19–32)
Calcium: 9.5 mg/dL (ref 8.4–10.5)
Creatinine, Ser: 1.36 mg/dL (ref 0.40–1.50)
GFR: 64.59 mL/min (ref >60.00)
GLUCOSE: 114 mg/dL — AB (ref 70–99)
POTASSIUM: 3.6 meq/L (ref 3.5–5.1)
Sodium: 138 mEq/L (ref 135–145)

## 2017-03-23 LAB — TSH: TSH: 2.37 u[IU]/mL (ref 0.35–4.50)

## 2017-03-23 LAB — SEDIMENTATION RATE: SED RATE: 47 mm/h — AB (ref 0–20)

## 2017-03-23 LAB — BRAIN NATRIURETIC PEPTIDE: PRO B NATRI PEPTIDE: 225 pg/mL — AB (ref 0.0–100.0)

## 2017-03-23 NOTE — Patient Instructions (Addendum)
No change in medications for now   Please remember to go to the lab and x-ray department downstairs in the basement  for your tests - we will call you with the results when they are available.      Please schedule a follow up office visit in 6 weeks, call sooner if needed with pfts on return

## 2017-03-23 NOTE — Progress Notes (Signed)
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 Pulmonary Consult    Referred by Dr. Keitha ButteUrsuy for eval of abnormal PFT's.  Pt states he was started on Amiodarone approx 6 months ago. He denies any respiratory co's.     prior to 6 months able to do yardwork and walk about any store and now can't do yardwork/ vacuuming or big store - just an aisle or 2 then stops due legs and beathing give out at slow pace  MMRC3 = can't walk 100 yards even at a slow pace at a flat grade s stopping due to sob   Assoc dry cough attibuted to pnds x years  Sleeps 30 degrees x one year    No obvious day to day or daytime variability or assoc excess/ purulent sputum or mucus plugs or hemoptysis or cp or chest tightness, subjective wheeze or overt sinus or hb symptoms. No unusual exp hx or h/o childhood pna/ asthma or knowledge of premature birth.  Sleeping ok flat without nocturnal  or early am exacerbation  of respiratory  c/o's or need for noct saba. Also denies any obvious fluctuation of symptoms with weather or environmental changes or other aggravating or alleviating factors except as outlined above   Current Allergies, Complete Past Medical History, Past Surgical History, Family History, and Social History were reviewed in [facility] electronic medical record.  ROS  The following are not active complaints unless bolded Hoarseness, sore throat, dysphagia, dental problems, itching, sneezing,  nasal congestion or discharge of excess mucus or purulent secretions, ear ache,   fever, chills, sweats, unintended wt loss or wt gain, classically pleuritic  or exertional cp,  orthopnea pnd or leg swelling, presyncope, palpitations, abdominal pain, anorexia, nausea, vomiting, diarrhea  or change in bowel habits or change in bladder habits, change in stools or change in urine, dysuria, hematuria,  rash, arthralgias, visual complaints, headache, numbness, weakness or ataxia or problems with walking or coordination,  change in mood/affect or memory.        Current Meds  Medication Sig  . acetaminophen (TYLENOL) 325 MG tablet Take 325 mg by mouth every 6 (six) hours as needed.  Marland Kitchen. amiodarone (PACERONE) 200 MG tablet Take 1 tablet (200 mg total) by mouth daily.  Marland Kitchen. apixaban (ELIQUIS) 5 MG TABS tablet Take 1 tablet (5 mg total) by mouth 2 (two) times daily.  . carvedilol (COREG) 6.25 MG tablet Take 1 tablet (6.25 mg total) by mouth 2 (two) times daily.  . furosemide (LASIX) 40 MG tablet Take 1 tablet (40 mg total) by mouth daily.  . potassium chloride (K-DUR) 10 MEQ tablet Take 1 tablet (10 mEq total) by mouth daily.  Marland Kitchen. spironolactone (ALDACTONE) 25 MG tablet Take 1 tablet (25 mg total) by mouth 2 (two) times daily.       Review of Systems     Objective:   Physical Exam    pleasant amb bm nad   Wt Readings from Last 3 Encounters:  03/23/17 200 lb (90.7 kg)  03/02/17 201 lb 12.8 oz (91.5 kg)  02/22/17 203 lb (92.1 kg)  Vital signs reviewed   - Note on arrival 02 sats  97% on RA    HEENT: nl  turbinates bilaterally, and oropharynx. Nl external ear canals without cough reflex - edentulous    NECK :  without JVD/Nodes/TM/ nl carotid upstrokes bilaterally   LUNGS: no acc muscle use,  Nl contour chest which is clear to A and P bilaterally without cough on insp or exp maneuvers   CV:  RRR  no s3  III/VI hsm rad to back or increase in P2, and no edema   ABD:  Obese/ soft and nontender with nl inspiratory excursion in the supine position. No bruits or organomegaly appreciated, bowel sounds nl  MS:  Nl gait/ ext warm without deformities, calf  tenderness, cyanosis or clubbing No obvious joint restrictions   SKIN: warm and dry without lesions    NEURO:  alert, approp, nl sensorium with  no motor or cerebellar deficits apparent.     CXR PA and Lateral:   03/23/2017 :    I personally reviewed images and agree with radiology impression as follows:    Chronic changes bilaterally without acute abnormality.  Labs ordered/ reviewed:      Chemistry      Component Value Date/Time   NA 138 03/23/2017 1536   NA 144 02/22/2017 1526   K 3.6 03/23/2017 1536   CL 103 03/23/2017 1536   CO2 24 03/23/2017 1536   BUN 18 03/23/2017 1536   BUN 16 02/22/2017 1526   CREATININE 1.36 03/23/2017 1536   CREATININE 1.74 (H) 05/18/2016 1424      Component Value Date/Time   CALCIUM 9.5 03/23/2017 1536   ALKPHOS 107 10/27/2016 1442   AST 22 10/27/2016 1442   ALT 17 10/27/2016 1442   BILITOT 0.6 10/27/2016 1442        Lab Results  Component Value Date   WBC 5.1 03/23/2017   HGB 14.9 03/23/2017   HCT 46.1 03/23/2017   MCV 88.8 03/23/2017   PLT 172.0 03/23/2017        Lab Results  Component Value Date   TSH 2.37 03/23/2017     Lab Results  Component Value Date   PROBNP 225.0 (H) 03/23/2017       Lab Results  Component Value Date   ESRSEDRATE 47 (H) 03/23/2017         Assessment:

## 2017-03-24 DIAGNOSIS — R058 Other specified cough: Secondary | ICD-10-CM | POA: Insufficient documentation

## 2017-03-24 DIAGNOSIS — R05 Cough: Secondary | ICD-10-CM | POA: Insufficient documentation

## 2017-03-24 LAB — RESPIRATORY ALLERGY PROFILE REGION II ~~LOC~~
ALLERGEN, COMM SILVER BIRCH, T3: 2.99 kU/L — AB
ALLERGEN, D PTERNOYSSINUS, D1: 0.23 kU/L — AB
Allergen, Cedar tree, t12: 1.28 kU/L — ABNORMAL HIGH
Allergen, Cottonwood, t14: 2 kU/L — ABNORMAL HIGH
Allergen, Mouse Urine Protein, e78: <0.1 kU/L
Allergen, Mulberry, t76: 0.36 kU/L — ABNORMAL HIGH
Allergen, Oak,t7: 2.24 kU/L — ABNORMAL HIGH
Allergen, P. notatum, m1: <0.1 kU/L
Aspergillus fumigatus, m3: <0.1 kU/L
BOX ELDER: 2.88 kU/L — AB
Bermuda Grass: 2.56 kU/L — ABNORMAL HIGH
CLADOSPORIUM HERBARUM (M2) IGE: <0.1 kU/L
CLASS: 0
CLASS: 0
CLASS: 0
CLASS: 2
CLASS: 2
CLASS: 2
CLASS: 2
CLASS: 2
COMMON RAGWEED (SHORT) (W1) IGE: 3.42 kU/L — ABNORMAL HIGH
Cat Dander: <0.1 kU/L
Class: 0
Class: 0
Class: 0
Class: 0
Class: 0
Class: 0
Class: 1
Class: 1
Class: 2
Class: 2
Class: 2
Class: 2
Class: 2
Class: 2
Class: 2
Class: 2
Cockroach: 0.47 kU/L — ABNORMAL HIGH
D. FARINAE: 0.23 kU/L — AB
Dog Dander: <0.1 kU/L
ELM IGE: 2.28 kU/L — AB
IGE (IMMUNOGLOBULIN E), SERUM: 1079 kU/L — AB (ref <=114)
JOHNSON GRASS: 0.95 kU/L — AB
PECAN/HICKORY TREE IGE: 2.47 kU/L — AB
Rough Pigweed  IgE: 1.72 kU/L — ABNORMAL HIGH
SHEEP SORREL IGE: 3.38 kU/L — AB
Timothy Grass: 1.95 kU/L — ABNORMAL HIGH

## 2017-03-24 LAB — INTERPRETATION:

## 2017-03-24 NOTE — Assessment & Plan Note (Addendum)
Echo 05/13/16 Echo Left ventricle: The cavity size was moderately dilated. Systolic   function was mildly to moderately reduced. The estimated ejection   fraction was in the range of 40% to 45%. Wall motion was normal;   there were no regional wall motion abnormalities. The study was   not technically sufficient to allow evaluation of LV diastolic   dysfunction due to atrial fibrillation. Doppler parameters are   consistent with high ventricular filling pressure. - Aortic valve: Moderately calcified annulus. Trileaflet; normal   thickness, moderately calcified leaflets. Moderate focal   calcification involving the noncoronary cusp. - Mitral valve: Calcified annulus. There was mild regurgitation. - Left atrium: The atrium was moderately dilated. - Pulmonary arteries: PA peak pressure: 47 mm Hg (S). Started amiodarone 05/13/16 -  PFT's  11/03/16   FVC  2.75 (93%) s obst  prior to study with DLCO  43/42 % corrects to 62  % for alv volume   - 03/23/2017   Walked RA x one lap @ 185 stopped due to sob with sats 93% at moderate pace - ESR 03/23/2017  = 47   Patients typically have been on amiodarone for 6-12 months before this complication manifests.  Of note, serial clinical evaluation for symptoms such as cough dyspnea or fevers is  the preferred method of monitoring for pulmonary toxicity because a decrease in DLCO or lung volumes is a nonspecific for toxicity. Pathologically amiodarone pulmonary toxicity may appear as interstitial pneumonitis, eosinophilic pneumonia, organizing pneumonia, pulmonary fibrosis or less commonly as diffuse alveolar hemorrhage, pulmonary nodules or pleural effusions.  Risk factors for pulmonary toxicity include age greater than 32, daily dose greater than equal to 400 mg, a high cumulative dose, or pre-existing lung disease     He has 2/4 risk factors and elevated esr with change in ex tol x 6 months that cannot be attributed to airflow obst though may have element of  obesity/deconditioning and diastolic dysfunction contributing so the best we can do is either rec trial off if worsens s obvious chf or pick alternative now if practical.  Will set up for f/u pfts / walking sats in 4-6 weeks but asked him to call in meantime if worsens    Total time devoted to counseling  > 50 % of initial 60 min office visit:  review case with pt/ discussion of options/alternatives/ personally creating written customized instructions  in presence of pt  then going over those specific  Instructions directly with the pt including how to use all of the meds but in particular covering each new medication in detail and the difference between the maintenance= "automatic" meds and the prns using an action plan format for the latter (If this problem/symptom => do that organization reading Left to right).  Please see AVS from this visit for a full list of these instructions which I personally wrote for this pt and  are unique to this visit.

## 2017-03-24 NOTE — Assessment & Plan Note (Addendum)
Allergy profile 03/23/2017 >  Eos 0.1 /  IgE  Pending   This is longstanding problem and likely not allergic and contributing to cough that existed before amiodarone rx which typically causes a cough on insp which is absent here.    Intranasal steroids and intranasal antihistamines are effective for symptoms associated with non-allergic rhinitis, whereas second generation antihistamines such as cetirizine, loratadine and fexofenadine have been found to be ineffective for this condition.   Will consider trial of nasal rx on return depending on results of above.

## 2017-03-24 NOTE — Progress Notes (Signed)
Spoke with the pt's spouse, Nellie and notified of results/recs. She verbalized understanding and will inform the pt.

## 2017-03-25 NOTE — Progress Notes (Signed)
Spoke with the pt's spouse and notified of results/recs.She verbalized understanding and will inform the pt.

## 2017-03-26 ENCOUNTER — Other Ambulatory Visit: Payer: Self-pay | Admitting: Physician Assistant

## 2017-03-28 ENCOUNTER — Other Ambulatory Visit: Payer: Self-pay | Admitting: Physician Assistant

## 2017-03-28 MED ORDER — POTASSIUM CHLORIDE ER 10 MEQ PO TBCR: 10.0000 meq | EXTENDED_RELEASE_TABLET | Freq: Every day | ORAL | 11 refills | Status: DC

## 2017-04-25 ENCOUNTER — Telehealth: Payer: Self-pay | Admitting: Physician Assistant

## 2017-04-25 NOTE — Telephone Encounter (Signed)
Patient wife, would like to know if patient should stop taking amiodarone medication? Mrs. Hilda Bladesrmstrong states that it was previously discussed that this medication may cause some issues. Mrs. Zwicker wanted to schedule with [name] only.

## 2017-04-26 NOTE — Telephone Encounter (Signed)
SPOKE TO WIFE ABOUT ANY CURRENT SYMPTOMS AND THAT WOULD BE A REASON FOR WANTING TO STOP

## 2017-04-27 NOTE — Telephone Encounter (Signed)
SPOKE TO WIFE ABOUT ANY CURRENT SYMPTOMS AND THAT WOULD BE A REASON FOR WANTING TO STOP. Pt WIFE MENTIONED THAT  THEY HAD DISCUSSED WITH URSUY ON LAST VISIT ABOUT POSSIBLY STOPPING AMIODARONE AND WANT TO GET SOME REASSURANCE AGAIN ABOUT THAT CHANGE.

## 2017-05-04 ENCOUNTER — Ambulatory Visit: Payer: Medicare Other | Admitting: Internal Medicine

## 2017-05-11 ENCOUNTER — Other Ambulatory Visit: Payer: Self-pay | Admitting: Nurse Practitioner

## 2017-05-11 NOTE — Telephone Encounter (Signed)
Medication Detail    Disp Refills Start End   spironolactone (ALDACTONE) 25 MG tablet 60 tablet 10 03/28/2017    Sig: TAKE 1 TABLET(25 MG) BY MOUTH TWICE DAILY   Sent to pharmacy as: spironolactone (ALDACTONE) 25 MG tablet   E-Prescribing Status: Receipt confirmed by pharmacy (03/28/2017 11:26 AM EDT)   Ph[facility]   [facility] [id number] - [place], [place] - [address] AT [address]

## 2017-06-06 NOTE — Progress Notes (Signed)
Cardiology Office Note Date:  06/06/2017  Patient ID:  Lucas [name], Lucas [name] 24-May-1934, MRN [id number] PCP:  Romero Belling, MD  Cardiologist:  Dr. Graciela Husbands (new 04/27/16)   Chief Complaint:  Planned f/u  History of Present Illness: Lucas [name] is a 83 y.o. male with history of HTN, HLD, TIA, PVD w/known carotid dz, was seen by Dr. Graciela Husbands 04/27/16 as a work-in new patient referred by the PMD for concerns of heart failure symptoms he was found in coarse AFib w/RVR and started on Coreg 6.25mg  BID, his furosemide increased to 80mg  daily, and started on Aldactone 25mg  daily given hx of hypokalemia.    He was seen in f/u by myself noting some confusion in his meds and had been off them for a couple days, he was still in RAFib, hypokalemic and his K+ was being replaced and he was resumed on his coreg and lasix noting his weight was up but not symptomatic.  A week laater 05/12/16 he was seen by Dicky Doe, PA and Dr. Clifton James wit persistent fluid OL, symptoms of CHF and admitted to the hospital.  Notes reviewed and she noting that intermittently he refused meds, insisted on going home only a couple days into his stay and planned for early f/u.  He saw Dr. Graciela Husbands 05/18/16 he was still in AF though improved rate, remained w/fluid OL and continued diuresis and planned for DCCV though when he arrived was in SR.  We have kept close f/u given his somewhat non-compliance with medicines (often self adjusting his medicines and significant hypokalemia.  Most recently aldactone was added, and then QOD Kdur (patient has reported intolerance of K+ with GI upset).  Most recently was seen by myself in March, at that time his K+ had improved, his edema improving with further adjustment in his diuretics.  I saw him in May, he was accompanied by his wife, he was feeling very well!  He parked on the back side of the lot and was able to walk all the way in without stopping to catch his breath.  His home weights had  been stable 188-189lbs.  He denied any kind of CP, no palpitations, no rest or nighttime SOB, uses 2 pillows to sleep for years, but wakes every couple hours to urinate.  No c/o dizziness, near syncope or syncope.  No reported bleeding or signs of bleeding.     He was seen by myself in September, again accompanied by his wife.  He had not seen his PMD or pulmonary evaluation.  He denied any kind of pulmonary symptoms.  No SOB, DOE, denies symptoms of PND or orthopnea, said the only thing keeping him up at night is the bathroom.  He denied any CP, palpitations, no dizziness, near syncope or syncope.  His weight was up, he did not feel bloated or like he is retaining water, said he had been eating very well.  He was referred to pulmonologist given his amiodarone and abnormal PFTs.  BMET/CBC were done and stable.   He saw Dr. Sherene Sires, his note discussed decreased DCLO was nonspecific for amio tox, and serial evaluations for symptoms the better method of monitoring for pulm toxicity.  Less likely so soon to be amio tox. Options were to stop the drug for a trial off or choose an alternative if practical,  or monitor for symptoms  The patient is very sedentary, he denies any difficulty with his ADLs, denies any SOB, cough, or pulmonary symptoms/concerns.  He is accompanied  by his wife, she states he does very little physically for years, and for years (well before the amiodarone) he would get winded with prolonged ambulation or increased level of exertion.  He denies any palpitations, CP, has no cardiac awareness and feels like he is doing very well.  No bleeding or signs of bleeding, reports compliance with all of his medicines.  AFib Hx First noted Nov 2017 Amiodarone started Dec 2017 Discussed annual eye exams, he sees an eye doctor bi-annually May TSH, LFts OK May 2018 abnormal PFTs referred to pulmonary via PMD though patient did not go  Past Medical History:  Diagnosis Date  . Acute CHF (congestive  heart failure) (HCC) dx'd 04/2016  . Acute on chronic systolic and diastolic heart failure, NYHA class 3 (HCC) 05/12/2016  . ALLERGIC RHINITIS 01/25/2007  . Arthritis    "back, legs, shoulders, arms, qwhere" (05/12/2016)  . Atrial fibrillation with RVR (HCC)    [name] 05/12/2016  . Chronic lower back pain   . DM 07/24/2008  . 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 Gout   . HYPERCHOLESTEROLEMIA 06/19/2008  . HYPERTENSION 06/19/2008  . HYPOKALEMIA 07/24/2008  . Irregular heart beat   . 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 Pre-diabetes    "borderline" (05/12/2016)  . PVD (peripheral vascular disease) (HCC)    [name] 05/12/2016  . TIA 06/26/2008    Past Surgical History:  Procedure Laterality Date  . BACK SURGERY    . CATARACT EXTRACTION W/ INTRAOCULAR LENS IMPLANT Left   . INGUINAL HERNIA REPAIR Left   . KNEE ARTHROSCOPY Left   . LUMBAR DISC SURGERY  1980s   "got hurt on the job"  . TONSILLECTOMY      Current Outpatient Medications  Medication Sig Dispense Refill  . acetaminophen (TYLENOL) 325 MG tablet Take 325 mg by mouth every 6 (six) hours as needed.    Marland Kitchen amiodarone (PACERONE) 200 MG tablet Take 1 tablet (200 mg total) by mouth daily. 90 tablet 2  . apixaban (ELIQUIS) 5 MG TABS tablet Take 1 tablet (5 mg total) by mouth 2 (two) times daily. 60 tablet 11  . carvedilol (COREG) 6.25 MG tablet TAKE 1 TABLET(6.25 MG) BY MOUTH TWICE DAILY 60 tablet 10  . furosemide (LASIX) 40 MG tablet Take 1 tablet (40 mg total) by mouth daily. 90 tablet 3  . potassium chloride (K-DUR) 10 MEQ tablet Take 1 tablet (10 mEq total) by mouth daily. 30 tablet 11  . spironolactone (ALDACTONE) 25 MG tablet TAKE 1 TABLET(25 MG) BY MOUTH TWICE DAILY 60 tablet 10   No current facility-administered medications for this visit.     Allergies:   Sulfonamide derivatives   Social History:  The patient  reports that  has never smoked. he has never used smokeless tobacco. He reports that  he does not drink alcohol or use drugs.   Family History:  The patient's family history includes Heart attack in his father; Stroke in his father; Uterine cancer in his sister.   ROS:  Please see the history of present illness.    All other systems are reviewed and otherwise negative.   PHYSICAL EXAM:  VS:  There were no vitals taken for this visit. BMI: There is no height or weight on file to calculate BMI. Well nourished, well developed, in no acute distress  HEENT: normocephalic, atraumatic  Neck: no JVD, carotid bruits or masses Cardiac: RRR; 1/6SM, no rubs, or gallops Lungs:  CTA b/l, no wheezing, rhonchi or  rales  Abd: soft, nontender MS: no deformity, age appropriate atrophy Ext:   1+ edema b/l LE Skin: warm and dry, no rash Neuro:  No gross deficits appreciated Psych: euthymic mood, full affect   EKG:  Not done today Done 07/19/16 SR, PAC's 05/27/16 SR/ectopic rhythm, PVCs  05/13/16: TTE Study Conclusions - Left ventricle: The cavity size was moderately dilated. Systolic   function was mildly to moderately reduced. The estimated ejection   fraction was in the range of 40% to 45%. Wall motion was normal;   there were no regional wall motion abnormalities. The study was   not technically sufficient to allow evaluation of LV diastolic   dysfunction due to atrial fibrillation. Doppler parameters are   consistent with high ventricular filling pressure. - Aortic valve: Moderately calcified annulus. Trileaflet; normal   thickness, moderately calcified leaflets. Moderate focal   calcification involving the noncoronary cusp. - Mitral valve: Calcified annulus. There was mild regurgitation. - Left atrium: The atrium was moderately dilated. - Pulmonary arteries: PA peak pressure: 47 mm Hg (S). Impressions: - The right ventricular systolic pressure was increased consistent   with moderate pulmonary hypertension.  2008 Vascular evaluation Dr. Darrick PennaFields  04/2007 Carotid US >80%  stenosis L ICA, <40% right Recommended CEA but patient declined  Recent Labs: 10/27/2016: ALT 17 03/23/2017: BUN 18; Creatinine, Ser 1.36; Hemoglobin 14.9; Platelets 172.0; Potassium 3.6; Pro B Natriuretic peptide (BNP) 225.0; Sodium 138; TSH 2.37  No results found for requested labs within last 8760 hours.   CrCl cannot be calculated (Patient's most recent lab result is older than the maximum 21 days allowed.).   Wt Readings from Last 3 Encounters:  03/23/17 200 lb (90.7 kg)  03/02/17 201 lb 12.8 oz (91.5 kg)  02/22/17 203 lb (92.1 kg)     Other studies reviewed: Additional studies/records reviewed today include: summarized above  ASSESSMENT AND PLAN:  1. Paroxysmal AFib     CHA2DS2Vasc is at least 3 on Eliquis     Amiodarone is at 200mg  daily     LFTs and TSH 7 months ago looked OK     No pulmonary symptoms      2. DCM, chronic combined (systolic/diastolic) CHF      Edema, some degree is chronic      He is liberal with soda and salt foods, he is reminded on sodium/fluid intake restrictions      For 2 days take his lasix and K+ BID, then resume daily     3. HTN     no changes today  4. Hypokalemia     Historically, will follow     BMET today, Oct K+ was 3.6      5. Known severe carotid disease from 2008     Pt then refused intervention, was recommended L CEA     No neuro symptoms     He has been encouraged follow up for this as well as statin tx     They report his PMD manages his cholesterol, he was on Simvastatin at some point but was stopped because of a "reaction", though uncertain what this was.  The patient has been firm in our previous discussions that he does not want to see a vascular specialist, worried about surgical complications if he should require surgical intervention, discussed potential CVA/neurological complications of carotid disease, he remains unagreeable      Disposition: as above, will see him back in 4 months, sooner if needed.   Current  medicines are  reviewed at length with the patient today.  The patient did not have any concerns regarding medicines.   Judith BlonderSigned, Darleny Sem Ursy, PA-C 06/06/2017 1:04 PM     [facility] [address] [place] [address] [phone] (office)  [phone] (fax)

## 2017-06-08 ENCOUNTER — Ambulatory Visit (INDEPENDENT_AMBULATORY_CARE_PROVIDER_SITE_OTHER): Payer: Medicare Other | Admitting: Physician Assistant

## 2017-06-08 ENCOUNTER — Encounter: Payer: Self-pay | Admitting: Physician Assistant

## 2017-06-08 ENCOUNTER — Encounter (INDEPENDENT_AMBULATORY_CARE_PROVIDER_SITE_OTHER): Payer: Self-pay

## 2017-06-08 VITALS — BP 132/78 | HR 82 | Ht 66.0 in | Wt 209.8 lb

## 2017-06-08 DIAGNOSIS — Z79899 Other long term (current) drug therapy: Secondary | ICD-10-CM

## 2017-06-08 DIAGNOSIS — I6522 Occlusion and stenosis of left carotid artery: Secondary | ICD-10-CM

## 2017-06-08 DIAGNOSIS — I10 Essential (primary) hypertension: Secondary | ICD-10-CM

## 2017-06-08 DIAGNOSIS — I5043 Acute on chronic combined systolic (congestive) and diastolic (congestive) heart failure: Secondary | ICD-10-CM

## 2017-06-08 DIAGNOSIS — I480 Paroxysmal atrial fibrillation: Secondary | ICD-10-CM | POA: Diagnosis not present

## 2017-06-08 NOTE — Patient Instructions (Addendum)
Medication Instructions:   FOR TWO DAYS ONLY TAKE TWICE DAY:    1. TAKE  LASIX 40 MG    2. TAKE POTASSIUM 10 MEQ   THE RESUME BACK TO ONCE A DAY     If you need a refill on your cardiac medications before your next appointment, please call your pharmacy.  Labwork: BMET TODAY    Testing/Procedures: NONE ORDERED  TODAY    Follow-Up:  IN 4 MONTHS WITH URSUY   Any Other Special Instructions Will Be Listed Below (If Applicable).

## 2017-06-09 LAB — BASIC METABOLIC PANEL
BUN / CREAT RATIO: 10 (ref 10–24)
BUN: 15 mg/dL (ref 8–27)
CHLORIDE: 102 mmol/L (ref 96–106)
CO2: 22 mmol/L (ref 20–29)
CREATININE: 1.5 mg/dL — AB (ref 0.76–1.27)
Calcium: 9.3 mg/dL (ref 8.6–10.2)
GFR calc Af Amer: 50 mL/min/{1.73_m2} — ABNORMAL LOW (ref >59)
GFR calc non Af Amer: 43 mL/min/{1.73_m2} — ABNORMAL LOW (ref >59)
GLUCOSE: 106 mg/dL — AB (ref 65–99)
POTASSIUM: 4.4 mmol/L (ref 3.5–5.2)
SODIUM: 145 mmol/L — AB (ref 134–144)

## 2017-07-14 ENCOUNTER — Other Ambulatory Visit: Payer: Self-pay | Admitting: Physician Assistant

## 2017-08-09 ENCOUNTER — Other Ambulatory Visit: Payer: Self-pay | Admitting: *Deleted

## 2017-08-09 NOTE — Patient Outreach (Signed)
[facility] Care Management  08/09/2017  Marliss CootsBobby R Giovanetti 05/18/1934 [id number]  Plan: Send to care management assista for case closure.  Colleen CanLinda Emaley Applin, RN BSN CCM Care Management Coordinator [facility] Care Management  [phone]

## 2017-08-23 ENCOUNTER — Other Ambulatory Visit: Payer: Self-pay | Admitting: *Deleted

## 2017-08-23 MED ORDER — APIXABAN 5 MG PO TABS: 5.0000 mg | ORAL_TABLET | Freq: Two times a day (BID) | ORAL | 2 refills | Status: DC

## 2017-08-23 NOTE — Telephone Encounter (Signed)
Pt is a 84 yr old male who saw PA with office on 06/08/17. SCr on that visit was 1.5. Pts weight was 95.2Kg. Per Fletcher AnonKelly, Pharm D keep pt on 5mg  BID until next visit in office in May and have serum creatine repeated at that visit.

## 2017-08-30 ENCOUNTER — Encounter: Payer: Self-pay | Admitting: Endocrinology

## 2017-08-30 ENCOUNTER — Ambulatory Visit (INDEPENDENT_AMBULATORY_CARE_PROVIDER_SITE_OTHER): Payer: Medicare Other | Admitting: Endocrinology

## 2017-08-30 VITALS — BP 160/88 | HR 70 | Wt 203.4 lb

## 2017-08-30 DIAGNOSIS — Z23 Encounter for immunization: Secondary | ICD-10-CM

## 2017-08-30 DIAGNOSIS — E7800 Pure hypercholesterolemia, unspecified: Secondary | ICD-10-CM

## 2017-08-30 DIAGNOSIS — I10 Essential (primary) hypertension: Secondary | ICD-10-CM | POA: Diagnosis not present

## 2017-08-30 DIAGNOSIS — I6522 Occlusion and stenosis of left carotid artery: Secondary | ICD-10-CM | POA: Diagnosis not present

## 2017-08-30 DIAGNOSIS — M1A9XX0 Chronic gout, unspecified, without tophus (tophi): Secondary | ICD-10-CM

## 2017-08-30 DIAGNOSIS — E119 Type 2 diabetes mellitus without complications: Secondary | ICD-10-CM | POA: Diagnosis not present

## 2017-08-30 DIAGNOSIS — R972 Elevated prostate specific antigen [PSA]: Secondary | ICD-10-CM

## 2017-08-30 DIAGNOSIS — Z0000 Encounter for general adult medical examination without abnormal findings: Secondary | ICD-10-CM

## 2017-08-30 LAB — BASIC METABOLIC PANEL
BUN: 17 mg/dL (ref 6–23)
CALCIUM: 9.7 mg/dL (ref 8.4–10.5)
CO2: 27 mEq/L (ref 19–32)
CREATININE: 1.29 mg/dL (ref 0.40–1.50)
Chloride: 101 mEq/L (ref 96–112)
GFR: 68.58 mL/min (ref >60.00)
GLUCOSE: 100 mg/dL — AB (ref 70–99)
Potassium: 4.8 mEq/L (ref 3.5–5.1)
Sodium: 135 mEq/L (ref 135–145)

## 2017-08-30 LAB — LIPID PANEL
CHOLESTEROL: 218 mg/dL — AB (ref 0–200)
HDL: 42 mg/dL (ref >39.00)
LDL CALC: 142 mg/dL — AB (ref 0–99)
NonHDL: 175.65
TRIGLYCERIDES: 167 mg/dL — AB (ref 0.0–149.0)
Total CHOL/HDL Ratio: 5
VLDL: 33.4 mg/dL (ref 0.0–40.0)

## 2017-08-30 LAB — URIC ACID: Uric Acid, Serum: 8.5 mg/dL — ABNORMAL HIGH (ref 4.0–7.8)

## 2017-08-30 LAB — PSA: PSA: 7.92 ng/mL — ABNORMAL HIGH (ref 0.10–4.00)

## 2017-08-30 LAB — POCT GLYCOSYLATED HEMOGLOBIN (HGB A1C): Hemoglobin A1C: 5.7

## 2017-08-30 MED ORDER — ROSUVASTATIN CALCIUM 5 MG PO TABS: 5.0000 mg | ORAL_TABLET | Freq: Every day | ORAL | 3 refills | Status: DC

## 2017-08-30 MED ORDER — ALLOPURINOL 100 MG PO TABS: 100.0000 mg | ORAL_TABLET | Freq: Every day | ORAL | 3 refills | Status: DC

## 2017-08-30 MED ORDER — CEFUROXIME AXETIL 250 MG PO TABS: 250.0000 mg | ORAL_TABLET | Freq: Two times a day (BID) | ORAL | 0 refills | Status: AC

## 2017-08-30 NOTE — Progress Notes (Signed)
we discussed code status.  pt requests full code, but would not want to be started or maintained on artificial life-support measures if there was not a reasonable chance of recovery 

## 2017-08-30 NOTE — Patient Instructions (Addendum)
I have sent a prescription to your pharmacy, for an antibiotic pill.  Loratadine-d and flonase nasal spray both non-prescription) will help your congestion.   Please continue the same other medications.  blood tests are requested for you today.  We'll let you know about the results. Please consider these measures for your health:  minimize alcohol.  Do not use tobacco products.  Have a colonoscopy at least every 10 years from age 89.  Keep firearms safely stored.  Always use seat belts.  have working smoke alarms in your home.  See an eye doctor and dentist regularly.  Never drive under the influence of alcohol or drugs (including prescription drugs).   It is critically important to prevent falling down (keep floor areas well-lit, dry, and free of loose objects.  If you have a cane, walker, or wheelchair, you should use it, even for short trips around the house.  Wear flat-soled shoes.  Also, try not to rush).   Please come back for a follow-up appointment in 6 months.

## 2017-08-30 NOTE — Progress Notes (Signed)
Subjective:    Patient ID: Lucas [name], male    DOB: [birth year], 84 y.o.   MRN: [id number]  HPI Pt states many years of seasonal moderate nasal congestion, but no pain at the ears.  He has assoc rhinorrhea.   Past Medical History:  Diagnosis Date  . Acute CHF (congestive heart failure) (HCC) dx'd 04/2016  . Acute on chronic systolic and diastolic heart failure, NYHA class 3 (HCC) 05/12/2016  . ALLERGIC RHINITIS 01/25/2007  . Arthritis    "back, legs, shoulders, arms, qwhere" (05/12/2016)  . Atrial fibrillation with RVR (HCC)    [name] 05/12/2016  . Chronic lower back pain   . DM 07/24/2008  . 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 Gout   . HYPERCHOLESTEROLEMIA 06/19/2008  . HYPERTENSION 06/19/2008  . HYPOKALEMIA 07/24/2008  . Irregular heart beat   . 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 Pre-diabetes    "borderline" (05/12/2016)  . PVD (peripheral vascular disease) (HCC)    [name] 05/12/2016  . TIA 06/26/2008    Past Surgical History:  Procedure Laterality Date  . BACK SURGERY    . CATARACT EXTRACTION W/ INTRAOCULAR LENS IMPLANT Left   . INGUINAL HERNIA REPAIR Left   . KNEE ARTHROSCOPY Left   . LUMBAR DISC SURGERY  1980s   "got hurt on the job"  . TONSILLECTOMY      Social History   Socioeconomic History  . Marital status: Married    Spouse name: Not on file  . Number of children: Not on file  . Years of education: Not on file  . Highest education level: Not on file  Occupational History  . Not on file  Social Needs  . Financial resource strain: Not on file  . Food insecurity:    Worry: Not on file    Inability: Not on file  . Transportation needs:    Medical: Not on file    Non-medical: Not on file  Tobacco Use  . Smoking status: Never Smoker  . Smokeless tobacco: Never Used  Substance and Sexual Activity  . Alcohol use: No  . Drug use: No  . Sexual activity: Not on file  Lifestyle  . Physical activity:    Days per week:  Not on file    Minutes per session: Not on file  . Stress: Not on file  Relationships  . Social connections:    Talks on phone: Not on file    Gets together: Not on file    Attends religious service: Not on file    Active member of club or organization: Not on file    Attends meetings of clubs or organizations: Not on file    Relationship status: Not on file  . Intimate partner violence:    Fear of current or ex partner: Not on file    Emotionally abused: Not on file    Physically abused: Not on file    Forced sexual activity: Not on file  Other Topics Concern  . Not on file  Social History Narrative  . Not on file    Current Outpatient Medications on File Prior to Visit  Medication Sig Dispense Refill  . acetaminophen (TYLENOL) 325 MG tablet Take 325 mg by mouth every 6 (six) hours as needed.    Marland Kitchen amiodarone (PACERONE) 200 MG tablet TAKE 1 TABLET(200 MG) BY MOUTH DAILY 90 tablet 3  . apixaban (ELIQUIS) 5 MG TABS tablet Take 1 tablet (5 mg total) by mouth 2 (two)  times daily. 60 tablet 2  . carvedilol (COREG) 6.25 MG tablet TAKE 1 TABLET(6.25 MG) BY MOUTH TWICE DAILY 60 tablet 10  . furosemide (LASIX) 40 MG tablet Take 1 tablet (40 mg total) by mouth daily. 90 tablet 3  . potassium chloride (K-DUR) 10 MEQ tablet Take 1 tablet (10 mEq total) by mouth daily. 30 tablet 11  . spironolactone (ALDACTONE) 25 MG tablet TAKE 1 TABLET(25 MG) BY MOUTH TWICE DAILY 60 tablet 10   No current facility-administered medications on file prior to visit.     Allergies  Allergen Reactions  . Sulfonamide Derivatives Nausea And Vomiting    Family History  Problem Relation Age of Onset  . Stroke Father   . Heart attack Father   . Uterine cancer Sister     BP (!) 160/88 (BP Location: Right Arm, Patient Position: Sitting, Cuff Size: Normal)   Pulse 70   Wt 203 lb 6.4 oz (92.3 kg)   SpO2 95%   BMI 32.83 kg/m   Review of Systems Denies sob and gout sxs    Objective:   Physical  Exam VITAL SIGNS:  See vs page GENERAL: no distress head: no deformity  eyes: no periorbital swelling, no proptosis  external nose and ears are normal  mouth: no lesion seen Both eac's and tm's are normal LUNGS:  Clear to auscultation  Lab Results  Component Value Date   HGBA1C 5.7 08/30/2017   I personally reviewed electrocardiogram tracing (today): Indication: HTN Impression: NSR.  Poss old AMI.  No hypertrophy. Compared to 2018: PAC's are not seen      Assessment & Plan:  Sinusitis, new. I have sent a prescription to your pharmacy, for an antibiotic pill.  Dyslipidemia: due for recheck.   Hypokalemia: due for recheck.  blood tests are requested for you today.  We'll let you know about the results.   Subjective:   Patient here for Medicare annual wellness visit and management of other chronic and acute problems.     Risk factors: advanced age    Roster of Physicians Providing Medical Care to Patient:  See "snapshot"   Activities of Daily Living: In your present state of health, do you have any difficulty performing the following activities?:  Preparing food and eating?: No  Bathing yourself: No  Getting dressed: No  Using the toilet:No  Moving around from place to place: No  In the past year have you fallen or had a near fall?:No    Home Safety: Has smoke detector and wears seat belts. No firearms.   Opioid Use: none   Diet and Exercise  Current exercise habits: pt says good Dietary issues discussed: pt reports a healthy diet   Depression Screen  Q1: Over the past two weeks, have you felt down, depressed or hopeless? no  Q2: Over the past two weeks, have you felt little interest or pleasure in doing things? no   The following portions of the patient's history were reviewed and updated as appropriate: allergies, current medications, past family history, past medical history, past social history, past surgical history and problem list.   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  remembers 3/3 at 5 minutes.  excellent recall.  can easily read and write a sentence.  alert and oriented x 3   Assessment:   Medicare wellness utd on preventive parameters.     Plan:   During the course of the visit the patient was educated and counseled about appropriate screening and preventive services including:        Fall prevention is advised today   Diabetes screening  Nutrition counseling is offered  advanced directives/end of life addressed today:  see healthcare directives hyperlink  Vaccines are updated as needed  Patient Instructions (the written plan) was given to the patient.

## 2017-09-12 ENCOUNTER — Telehealth: Payer: Self-pay | Admitting: Endocrinology

## 2017-09-12 NOTE — Telephone Encounter (Signed)
Patient is returning your phone call. 

## 2017-09-12 NOTE — Telephone Encounter (Signed)
I called patient back & left detailed VM. I asked patient call back with any further questions.

## 2017-09-27 ENCOUNTER — Other Ambulatory Visit: Payer: Self-pay | Admitting: Endocrinology

## 2017-09-27 ENCOUNTER — Telehealth: Payer: Self-pay | Admitting: Internal Medicine

## 2017-09-27 NOTE — Telephone Encounter (Signed)
New Message:     Pt c/o medication issue:  1. Name of Medication: allopurinol (ZYLOPRIM) 100 MG tablet  rosuvastatin (CRESTOR) 5 MG tablet 2. How are you currently taking this medication (dosage and times per day)?  Take 1 tablet (100 mg total) by mouth daily./ Take 1 tablet (5 mg total) by mouth daily. 3. Are you having a reaction (difficulty breathing--STAT)? No  4. What is your medication issue? Pt's wife states he is having so much fatigue and soreness in his knees that he can barely move. She states he wasn't feeling this way until he started taking this medication.

## 2017-09-28 NOTE — Telephone Encounter (Signed)
LVM on pts answering service

## 2017-09-28 NOTE — Telephone Encounter (Signed)
Spoke with pt's wife and advised her to speak with her husbands PCP regarding statin therapy. She was concerned Crestor was interacting with his heart medication and causing him leg soreness. I told her it is possibly the statin by itself causing his leg cramps and he may need to change his statin medication. She stated she would call his PCP for advice. She had no additional questions.

## 2017-11-09 DIAGNOSIS — M1612 Unilateral primary osteoarthritis, left hip: Secondary | ICD-10-CM | POA: Diagnosis not present

## 2017-11-22 ENCOUNTER — Other Ambulatory Visit: Payer: Self-pay | Admitting: Internal Medicine

## 2017-11-22 NOTE — Telephone Encounter (Signed)
Eliquis 5mg  refill request received; pt is 84 yrs old, Crea-1.29 on 08/30/17, wt-92.3kg, last seen by Francis Dowseenee Ursuy on 06/27/17; will send in refill to requested pharmacy.

## 2017-11-24 ENCOUNTER — Telehealth: Payer: Self-pay

## 2017-11-24 NOTE — Telephone Encounter (Signed)
Patient is experiencing cold symptoms and wants to know if it is safe for him to take mucinex- please advise in Dr. Remus BlakeKumar's absence

## 2017-11-24 NOTE — Telephone Encounter (Signed)
Gave patient advice and she stated an understanding 

## 2017-11-24 NOTE — Telephone Encounter (Signed)
Yes, that is OK. 

## 2017-11-28 ENCOUNTER — Ambulatory Visit (INDEPENDENT_AMBULATORY_CARE_PROVIDER_SITE_OTHER): Payer: Medicare Other | Admitting: Endocrinology

## 2017-11-28 ENCOUNTER — Ambulatory Visit
Admission: RE | Admit: 2017-11-28 | Discharge: 2017-11-28 | Disposition: A | Discharge status: Home / Self Care | Payer: Medicare Other | Source: Ambulatory Visit | Attending: Endocrinology | Admitting: Endocrinology

## 2017-11-28 ENCOUNTER — Encounter: Payer: Self-pay | Admitting: Endocrinology

## 2017-11-28 VITALS — BP 138/64 | HR 87 | Wt 201.2 lb

## 2017-11-28 DIAGNOSIS — R05 Cough: Secondary | ICD-10-CM

## 2017-11-28 DIAGNOSIS — I6522 Occlusion and stenosis of left carotid artery: Secondary | ICD-10-CM | POA: Diagnosis not present

## 2017-11-28 DIAGNOSIS — R059 Cough, unspecified: Secondary | ICD-10-CM

## 2017-11-28 MED ORDER — AMOXICILLIN 500 MG PO CAPS: 500.0000 mg | ORAL_CAPSULE | Freq: Three times a day (TID) | ORAL | 0 refills | Status: DC

## 2017-11-28 NOTE — Patient Instructions (Addendum)
A chest x-ray is requested for you today.  We'll let you know about the results. You can stop taking the potassium pill. I have sent a prescription to your pharmacy, for an antibiotic pill.  I hope you feel better soon.  If you don't feel better by next week, please call back.  Please call sooner if you get worse.

## 2017-11-28 NOTE — Progress Notes (Signed)
Subjective:    Patient ID: Marliss CootsBobby R Woolf, male    DOB: 05/07/1934, 84 y.o.   MRN: [id number]  HPI 2 weeks of moderate prod-quality cough in the chest, and assoc rhinorrhea.  He also has wheezing.   Past Medical History:  Diagnosis Date  . Acute CHF (congestive heart failure) (HCC) dx'd 04/2016  . Acute on chronic systolic and diastolic heart failure, NYHA class 3 (HCC) 05/12/2016  . ALLERGIC RHINITIS 01/25/2007  . Arthritis    "back, legs, shoulders, arms, qwhere" (05/12/2016)  . Atrial fibrillation with RVR (HCC)    [name]/notes 05/12/2016  . Chronic lower back pain   . DM 07/24/2008  . 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 Gout   . HYPERCHOLESTEROLEMIA 06/19/2008  . HYPERTENSION 06/19/2008  . HYPOKALEMIA 07/24/2008  . Irregular heart beat   . 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 Pre-diabetes    "borderline" (05/12/2016)  . PVD (peripheral vascular disease) (HCC)    [name]/notes 05/12/2016  . TIA 06/26/2008    Past Surgical History:  Procedure Laterality Date  . BACK SURGERY    . CATARACT EXTRACTION W/ INTRAOCULAR LENS IMPLANT Left   . INGUINAL HERNIA REPAIR Left   . KNEE ARTHROSCOPY Left   . LUMBAR DISC SURGERY  1980s   "got hurt on the job"  . TONSILLECTOMY      Social History   Socioeconomic History  . Marital status: Married    Spouse name: Not on file  . Number of children: Not on file  . Years of education: Not on file  . Highest education level: Not on file  Occupational History  . Not on file  Social Needs  . Financial resource strain: Not on file  . Food insecurity:    Worry: Not on file    Inability: Not on file  . Transportation needs:    Medical: Not on file    Non-medical: Not on file  Tobacco Use  . Smoking status: Never Smoker  . Smokeless tobacco: Never Used  Substance and Sexual Activity  . Alcohol use: No  . Drug use: No  . Sexual activity: Not on file  Lifestyle  . Physical activity:    Days per week: Not on file   Minutes per session: Not on file  . Stress: Not on file  Relationships  . Social connections:    Talks on phone: Not on file    Gets together: Not on file    Attends religious service: Not on file    Active member of club or organization: Not on file    Attends meetings of clubs or organizations: Not on file    Relationship status: Not on file  . Intimate partner violence:    Fear of current or ex partner: Not on file    Emotionally abused: Not on file    Physically abused: Not on file    Forced sexual activity: Not on file  Other Topics Concern  . Not on file  Social History Narrative  . Not on file    Current Outpatient Medications on File Prior to Visit  Medication Sig Dispense Refill  . acetaminophen (TYLENOL) 325 MG tablet Take 325 mg by mouth every 6 (six) hours as needed.    Marland Kitchen. allopurinol (ZYLOPRIM) 100 MG tablet Take 1 tablet (100 mg total) by mouth daily. (Patient not taking: Reported on 11/28/2017) 90 tablet 3  . amiodarone (PACERONE) 200 MG tablet TAKE 1 TABLET(200 MG) BY MOUTH DAILY 90  tablet 3  . carvedilol (COREG) 6.25 MG tablet TAKE 1 TABLET(6.25 MG) BY MOUTH TWICE DAILY 60 tablet 10  . ELIQUIS 5 MG TABS tablet TAKE 1 TABLET(5 MG) BY MOUTH TWICE DAILY 60 tablet 6  . furosemide (LASIX) 40 MG tablet Take 1 tablet (40 mg total) by mouth daily. 90 tablet 3  . rosuvastatin (CRESTOR) 5 MG tablet Take 1 tablet (5 mg total) by mouth daily. (Patient not taking: Reported on 11/28/2017) 90 tablet 3  . spironolactone (ALDACTONE) 25 MG tablet TAKE 1 TABLET(25 MG) BY MOUTH TWICE DAILY 60 tablet 10   No current facility-administered medications on file prior to visit.     Allergies  Allergen Reactions  . Sulfonamide Derivatives Nausea And Vomiting    Family History  Problem Relation Age of Onset  . Stroke Father   . Heart attack Father   . Uterine cancer Sister     BP 138/64 (BP Location: Right Arm, Patient Position: Sitting, Cuff Size: Normal)   Pulse 87   Wt 201 lb  3.2 oz (91.3 kg)   SpO2 97%   BMI 32.47 kg/m    Review of Systems Denies fever, earache, and sob.      Objective:   Physical Exam VITAL SIGNS:  See vs page GENERAL: no distress head: no deformity  eyes: no periorbital swelling, no proptosis.   external nose and ears are normal  mouth: no lesion seen.   Both eac's and tm's are normal.   LUNGS:  Clear to auscultation, except for rales at the right base.    Lab Results  Component Value Date   CREATININE 1.29 08/30/2017   BUN 17 08/30/2017   NA 135 08/30/2017   K 4.8 08/30/2017   CL 101 08/30/2017   CO2 27 08/30/2017       Assessment & Plan:  Cough, new.  R/o pneumonia: he declines inhaler and cough medication.   Hypokalemia: he does not need KLOR now.    Patient Instructions  A chest x-ray is requested for you today.  We'll let you know about the results. You can stop taking the potassium pill. I have sent a prescription to your pharmacy, for an antibiotic pill.  I hope you feel better soon.  If you don't feel better by next week, please call back.  Please call sooner if you get worse.

## 2017-12-12 ENCOUNTER — Ambulatory Visit: Payer: Medicare Other | Admitting: Physician Assistant

## 2017-12-15 NOTE — Progress Notes (Signed)
Cardiology Office Note Date:  12/16/2017  Patient ID:  [name], Lucas 10-May-1934, MRN [id number] PCP:  Romero Belling, MD  Cardiologist:  Dr. Graciela Husbands (new 04/27/16)   Chief Complaint:  Planned f/u  History of Present Illness: Lucas Lucas [name] is a 84 y.o. male with history of HTN, HLD, TIA, PVD w/known carotid dz, was seen by Dr. Graciela Husbands 04/27/16 as a work-in new patient referred by the PMD for concerns of heart failure symptoms he was found in coarse AFib w/RVR and started on Coreg 6.25mg  BID, his furosemide increased to 80mg  daily, and started on Aldactone 25mg  daily given hx of hypokalemia.    He was seen in f/u by myself noting some confusion in his meds and had been off them for a couple days, he was still in RAFib, hypokalemic and his K+ was being replaced and he was resumed on his coreg and lasix noting his weight was up but not symptomatic.  A week laater 05/12/16 he was seen by Lucas Doe, PA and Dr. Clifton James wit persistent fluid OL, symptoms of CHF and admitted to the hospital.  Notes reviewed and she noting that intermittently he refused meds, insisted on going home only a couple days into his stay and planned for early f/u.  He saw Dr. Graciela Husbands 05/18/16 he was still in AF though improved rate, remained w/fluid OL and continued diuresis and planned for DCCV though when he arrived was in SR.  We have kept close f/u given his somewhat non-compliance with medicines (often self adjusting his medicines and significant hypokalemia.  Most recently aldactone was added, and then QOD Kdur (patient has reported intolerance of K+ with GI upset).  Most recently was seen by myself in March, at that time his K+ had improved, his edema improving with further adjustment in his diuretics.  I saw him in May, he was accompanied by his wife, he was feeling very well!  He parked on the back side of the lot and was able to walk all the way in without stopping to catch his breath.  His home weights had been  stable 188-189lbs.  He denied any kind of CP, no palpitations, no rest or nighttime SOB, uses 2 pillows to sleep for years, but wakes every couple hours to urinate.  No c/o dizziness, near syncope or syncope.  No reported bleeding or signs of bleeding.     He was seen by myself in September, again accompanied by his wife.  He had not seen his PMD or pulmonary evaluation.  He denied any kind of pulmonary symptoms.  No SOB, Lucas [name], denies symptoms of PND or orthopnea, said the only thing keeping him up at night is the bathroom.  He denied any CP, palpitations, no dizziness, near syncope or syncope.  His weight was up, he did not feel bloated or like he is retaining water, said he had been eating very well.  He was referred to pulmonologist given his amiodarone and abnormal PFTs.  BMET/CBC were done and stable.   He saw Dr. Sherene Sires, his note discussed decreased DCLO was nonspecific for amio tox, and serial evaluations for symptoms the better method of monitoring for pulm toxicity.  Less likely so soon to be amio tox. Options were to stop the drug for a trial off or choose an alternative if practical,  or monitor for symptoms  I saw him in Jan 2019 , he denied any difficulty with his ADLs, denied any SOB, cough, or pulmonary symptoms/concerns.  He  was accompanied by his wife, she stated he does very little physically for years, and for years (well before the amiodarone) he would get winded with prolonged ambulation or increased level of exertion.  He denied any palpitations, CP, has no cardiac awareness and feels like he is doing very well.  No bleeding or signs of bleeding, reported compliance with all of his medicines.  He is accompanied by his wife.  He is doing well.  He states he had a "summer cold" and feels better after the antibiotic his PMD gave him.  He is no longer on K+ replacement after that visit as well.  He denies any kind of CP, no SOB at rest or with exertion, no PND or orthopnea.  Says the only  think keeping him up at night is his water pill.  No dizziness, near syncope or syncope.  No neuro symptoms.  No bleeding or signs of bleeding.  He reports feeling pretty well.  I notice he is edematous, he mentions last weekend riding withhis grandson's to [place] and back a few days ago, thinks this is why, also thinks the allopurinol and or the crestor his PMD started him on contributed and in d/w his PMD both were stopped.   AFib Hx First noted Nov 2017 Amiodarone started Dec 2017 Re-discussed annual eye exams, he sees an eye doctor bi-annually  May 2018 abnormal PFTs referred to pulmonary (noted above)  Past Medical History:  Diagnosis Date  . Acute CHF (congestive heart failure) (HCC) dx'd 04/2016  . Acute on chronic systolic and diastolic heart failure, NYHA class 3 (HCC) 05/12/2016  . ALLERGIC RHINITIS 01/25/2007  . Arthritis    "back, legs, shoulders, arms, qwhere" (05/12/2016)  . Atrial fibrillation with RVR (HCC)    [name] 05/12/2016  . Chronic lower back pain   . DM 07/24/2008  . 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 Gout   . HYPERCHOLESTEROLEMIA 06/19/2008  . HYPERTENSION 06/19/2008  . HYPOKALEMIA 07/24/2008  . Irregular heart beat   . 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 Pre-diabetes    "borderline" (05/12/2016)  . PVD (peripheral vascular disease) (HCC)    [name] 05/12/2016  . TIA 06/26/2008    Past Surgical History:  Procedure Laterality Date  . BACK SURGERY    . CATARACT EXTRACTION W/ INTRAOCULAR LENS IMPLANT Left   . INGUINAL HERNIA REPAIR Left   . KNEE ARTHROSCOPY Left   . LUMBAR DISC SURGERY  1980s   "got hurt on the job"  . TONSILLECTOMY      Current Outpatient Medications  Medication Sig Dispense Refill  . acetaminophen (TYLENOL) 325 MG tablet Take 325 mg by mouth every 6 (six) hours as needed.    Marland Kitchen allopurinol (ZYLOPRIM) 100 MG tablet Take 1 tablet (100 mg total) by mouth daily. 90 tablet 3  . amiodarone (PACERONE) 200 MG  tablet TAKE 1 TABLET(200 MG) BY MOUTH DAILY 90 tablet 3  . amoxicillin (AMOXIL) 500 MG capsule Take 1 capsule (500 mg total) by mouth 3 (three) times daily. 30 capsule 0  . carvedilol (COREG) 6.25 MG tablet TAKE 1 TABLET(6.25 MG) BY MOUTH TWICE DAILY 60 tablet 10  . ELIQUIS 5 MG TABS tablet TAKE 1 TABLET(5 MG) BY MOUTH TWICE DAILY 60 tablet 6  . rosuvastatin (CRESTOR) 5 MG tablet Take 1 tablet (5 mg total) by mouth daily. 90 tablet 3  . spironolactone (ALDACTONE) 25 MG tablet TAKE 1 TABLET(25 MG) BY MOUTH TWICE DAILY 60 tablet 10  .  furosemide (LASIX) 40 MG tablet Take 1 tablet (40 mg total) by mouth daily. 90 tablet 3   No current facility-administered medications for this visit.     Allergies:   Sulfonamide derivatives   Social History:  The patient  reports that he has never smoked. He has never used smokeless tobacco. He reports that he does not drink alcohol or use drugs.   Family History:  The patient's family history includes Heart attack in his father; Stroke in his father; Uterine cancer in his sister.   ROS:  Please see the history of present illness.    All other systems are reviewed and otherwise negative.   PHYSICAL EXAM:  VS:  BP (!) 154/84   Pulse 72   Ht 5\' 6"  (1.676 m)   Wt 203 lb (92.1 kg)   BMI 32.77 kg/m  BMI: Body mass index is 32.77 kg/m. Well nourished, well developed, in no acute distress  HEENT: normocephalic, atraumatic  Neck: no JVD, carotid bruits or masses Cardiac: RRR; 1-2/6SM, no rubs, or gallops Lungs:  CTA b/l, no wheezing, rhonchi or rales  Abd: soft, nontender MS: no deformity, age appropriate atrophy Ext:   1-2+ edema b/l LE to mid-shin Skin: warm and dry, no rash Neuro:  No gross deficits appreciated Psych: euthymic mood, full affect   EKG:  Not done today   05/13/16: TTE Study Conclusions - Left ventricle: The cavity size was moderately dilated. Systolic   function was mildly to moderately reduced. The estimated ejection   fraction  was in the range of 40% to 45%. Wall motion was normal;   there were no regional wall motion abnormalities. The study was   not technically sufficient to allow evaluation of LV diastolic   dysfunction due to atrial fibrillation. Doppler parameters are   consistent with high ventricular filling pressure. - Aortic valve: Moderately calcified annulus. Trileaflet; normal   thickness, moderately calcified leaflets. Moderate focal   calcification involving the noncoronary cusp. - Mitral valve: Calcified annulus. There was mild regurgitation. - Left atrium: The atrium was moderately dilated. - Pulmonary arteries: PA peak pressure: 47 mm Hg (S). Impressions: - The right ventricular systolic pressure was increased consistent   with moderate pulmonary hypertension.  2008 Vascular evaluation Dr. Darrick PennaFields  04/2007 Carotid US >80% stenosis L ICA, <40% right Recommended CEA but patient declined  Recent Labs: 03/23/2017: Hemoglobin 14.9; Platelets 172.0; Pro B Natriuretic peptide (BNP) 225.0; TSH 2.37 08/30/2017: BUN 17; Creatinine, Ser 1.29; Potassium 4.8; Sodium 135  08/30/2017: Cholesterol 218; HDL 42.00; LDL Cholesterol 142; Total CHOL/HDL Ratio 5; Triglycerides 167.0; VLDL 33.4   CrCl cannot be calculated (Patient's most recent lab result is older than the maximum 21 days allowed.).   Wt Readings from Last 3 Encounters:  12/16/17 203 lb (92.1 kg)  11/28/17 201 lb 3.2 oz (91.3 kg)  08/30/17 203 lb 6.4 oz (92.3 kg)     Other studies reviewed: Additional studies/records reviewed today include: summarized above  ASSESSMENT AND PLAN:  1. Paroxysmal AFib     CHA2DS2Vasc is at least 3 on Eliquis     Amiodarone is at 200mg  daily     No pulmonary symptoms     BMET, CBC, LFTs and TSH today      2. DCM, chronic combined (systolic/diastolic) CHF     On BB     Edema, some degree is chronic, though looks more then I recall, he thinks is better then it was 2 weeks ago  He remains very liberal  with soda though reports doing better with less salty  foods, he is reminded on sodium/fluid intake restrictions as well as reducing/eliminating soda for water  Lasix BID for 3 days then resume daily Pending BMET, may add on ACE or ARB, need to leave some room on BP given carotid disease (today's has plenty of room)     3. HTN     no changes today  4. Hypokalemia     Historically     Better of late, recently PMD stopped his K+ replacement     BMET today      5. Known severe carotid disease from 2008     Pt then refused intervention, was recommended L CEA     No neuro symptoms     He has been encouraged follow up for this as well as statin tx     They report his PMD manages his cholesterol, he was on Simvastatin at some point but was stopped because of a "reaction", though uncertain what this was.  More recently low dose Crestor and this was stopped as well  The patient has been firm in our previous discussions and again today that he does not want to see a vascular specialist, worried about surgical complications if he should require surgical intervention, discussed potential CVA/neurological complications of carotid disease, he remains unagreeable Urged again then aggressive management of cholesterol, they will revisit with his PMD      Disposition: as above, will see him back in 3-4 mo, sooner if needed.  If edema does not improve/resolve they will let us know.   Current medicines are reviewed at length with the patient today.  The patient did not have any concerns regarding medicines.   Lucas Blonder, PA-C 12/16/2017 11:52 AM     [facility] [address] [place] [address] [phone] (office)  [phone] (fax)

## 2017-12-16 ENCOUNTER — Ambulatory Visit (INDEPENDENT_AMBULATORY_CARE_PROVIDER_SITE_OTHER): Payer: Medicare Other | Admitting: Physician Assistant

## 2017-12-16 VITALS — BP 154/84 | HR 72 | Ht 66.0 in | Wt 203.0 lb

## 2017-12-16 DIAGNOSIS — I5022 Chronic systolic (congestive) heart failure: Secondary | ICD-10-CM | POA: Diagnosis not present

## 2017-12-16 DIAGNOSIS — Z79899 Other long term (current) drug therapy: Secondary | ICD-10-CM

## 2017-12-16 DIAGNOSIS — I6522 Occlusion and stenosis of left carotid artery: Secondary | ICD-10-CM | POA: Diagnosis not present

## 2017-12-16 DIAGNOSIS — I480 Paroxysmal atrial fibrillation: Secondary | ICD-10-CM

## 2017-12-16 DIAGNOSIS — I10 Essential (primary) hypertension: Secondary | ICD-10-CM | POA: Diagnosis not present

## 2017-12-16 DIAGNOSIS — I420 Dilated cardiomyopathy: Secondary | ICD-10-CM | POA: Diagnosis not present

## 2017-12-16 NOTE — Patient Instructions (Addendum)
Medication Instructions:    TAKE 80 MG LASIX FOR 3 DAYS THEN GO BACK TO 40 MG ONCE A DAY    If you need a refill on your cardiac medications before your next appointment, please call your pharmacy.  Labwork:  BMET CBC TSH AND LFT     Testing/Procedures: NONE ORDERED  TODAY    Follow-Up:  IN 3 TO 4 MONTHS WITH URSUY   Any Other Special Instructions Will Be Listed Below (If Applicable).

## 2017-12-17 LAB — BASIC METABOLIC PANEL
BUN / CREAT RATIO: 9 — AB (ref 10–24)
BUN: 11 mg/dL (ref 8–27)
CO2: 24 mmol/L (ref 20–29)
CREATININE: 1.2 mg/dL (ref 0.76–1.27)
Calcium: 9 mg/dL (ref 8.6–10.2)
Chloride: 101 mmol/L (ref 96–106)
GFR, EST AFRICAN AMERICAN: 65 mL/min/{1.73_m2} (ref >59)
GFR, EST NON AFRICAN AMERICAN: 56 mL/min/{1.73_m2} — AB (ref >59)
Glucose: 79 mg/dL (ref 65–99)
POTASSIUM: 3.8 mmol/L (ref 3.5–5.2)
SODIUM: 143 mmol/L (ref 134–144)

## 2017-12-17 LAB — HEPATIC FUNCTION PANEL
ALBUMIN: 3.6 g/dL (ref 3.5–4.7)
ALT: 12 IU/L (ref 0–44)
AST: 15 IU/L (ref 0–40)
Alkaline Phosphatase: 139 IU/L — ABNORMAL HIGH (ref 39–117)
Bilirubin Total: 0.5 mg/dL (ref 0.0–1.2)
Bilirubin, Direct: 0.2 mg/dL (ref 0.00–0.40)
TOTAL PROTEIN: 7 g/dL (ref 6.0–8.5)

## 2017-12-17 LAB — CBC
Hematocrit: 41.3 % (ref 37.5–51.0)
Hemoglobin: 13.1 g/dL (ref 13.0–17.7)
MCH: 28.7 pg (ref 26.6–33.0)
MCHC: 31.7 g/dL (ref 31.5–35.7)
MCV: 90 fL (ref 79–97)
Platelets: 266 10*3/uL (ref 150–450)
RBC: 4.57 x10E6/uL (ref 4.14–5.80)
RDW: 15.5 % — AB (ref 12.3–15.4)
WBC: 7 10*3/uL (ref 3.4–10.8)

## 2017-12-17 LAB — TSH: TSH: 2.44 u[IU]/mL (ref 0.450–4.500)

## 2017-12-23 ENCOUNTER — Telehealth: Payer: Self-pay | Admitting: *Deleted

## 2017-12-23 ENCOUNTER — Encounter: Payer: Self-pay | Admitting: *Deleted

## 2017-12-23 DIAGNOSIS — I509 Heart failure, unspecified: Secondary | ICD-10-CM

## 2017-12-23 MED ORDER — LOSARTAN POTASSIUM 25 MG PO TABS: 25.0000 mg | ORAL_TABLET | Freq: Every day | ORAL | 5 refills | Status: DC

## 2017-12-23 NOTE — Telephone Encounter (Signed)
SPOKE WITH PT WIFE ABOUT RESULTS AND MEDICATION CHANGES OF KDUR 10 MEQ QOD AND LOSARTAN 25 MG ONCE A DAY.  PT WILL RETURN 01-06-18 FOR REQUESTED LAB WORK

## 2017-12-23 NOTE — Telephone Encounter (Signed)
-----   Message from Sheilah Pigeonenee Lynn Ursuy, [place] sent at 12/19/2017  6:53 AM EDT ----- Labs look OK.  Potassium is wnl, though trending down.  Please resume his KDUR 10meq though to take every other day.  Add losartan 25mg  daily.  Repeat BMET in 10-14 days.  Thanks renee

## 2018-01-06 ENCOUNTER — Other Ambulatory Visit: Payer: Medicare Other | Admitting: *Deleted

## 2018-01-06 DIAGNOSIS — I509 Heart failure, unspecified: Secondary | ICD-10-CM | POA: Diagnosis not present

## 2018-01-06 LAB — BASIC METABOLIC PANEL
BUN/Creatinine Ratio: 11 (ref 10–24)
BUN: 15 mg/dL (ref 8–27)
CO2: 25 mmol/L (ref 20–29)
CREATININE: 1.34 mg/dL — AB (ref 0.76–1.27)
Calcium: 9.1 mg/dL (ref 8.6–10.2)
Chloride: 100 mmol/L (ref 96–106)
GFR calc non Af Amer: 49 mL/min/{1.73_m2} — ABNORMAL LOW (ref >59)
GFR, EST AFRICAN AMERICAN: 57 mL/min/{1.73_m2} — AB (ref >59)
Glucose: 113 mg/dL — ABNORMAL HIGH (ref 65–99)
Potassium: 3.3 mmol/L — ABNORMAL LOW (ref 3.5–5.2)
SODIUM: 139 mmol/L (ref 134–144)

## 2018-01-09 ENCOUNTER — Telehealth: Payer: Self-pay | Admitting: *Deleted

## 2018-01-09 DIAGNOSIS — Z79899 Other long term (current) drug therapy: Secondary | ICD-10-CM

## 2018-01-09 NOTE — Telephone Encounter (Signed)
-----   Message from [name] Lynn Ursuy, PA-C sent at 01/09/2018  6:47 AM EDT ----- Potassium is a bit low, increase to 10meq daily please and recheck in a week.    Thanks renee 

## 2018-01-09 NOTE — Telephone Encounter (Signed)
-----   Message from [facility] Lynn ReadingUrsuy, [place]C sent at 01/09/2018  6:47 AM EDT ----- Potassium is a bit low, increase to 10meq daily please and recheck in a week.    Thanks renee

## 2018-01-09 NOTE — Telephone Encounter (Signed)
LMOVM TO CALL BACK FOR RESULTS AND MED CHANGES

## 2018-01-09 NOTE — Telephone Encounter (Signed)
SPOKE WITH WIFE ABOUT RESULTS AND POTASSIUM 10 MEQ CHANGE DAILY AND TO RETURN FOR LABS THE FOLLOWING  MONDAY

## 2018-01-09 NOTE — Addendum Note (Signed)
Addended by: Oleta MouseVERTON, SHANA M on: 01/09/2018 04:45 PM   Modules accepted: Orders

## 2018-01-10 ENCOUNTER — Other Ambulatory Visit: Payer: Medicare Other

## 2018-01-16 ENCOUNTER — Other Ambulatory Visit: Payer: Medicare Other | Admitting: *Deleted

## 2018-01-16 DIAGNOSIS — Z79899 Other long term (current) drug therapy: Secondary | ICD-10-CM | POA: Diagnosis not present

## 2018-01-16 LAB — POTASSIUM: Potassium: 4.2 mmol/L (ref 3.5–5.2)

## 2018-01-20 ENCOUNTER — Telehealth: Payer: Self-pay | Admitting: *Deleted

## 2018-01-20 DIAGNOSIS — Z79899 Other long term (current) drug therapy: Secondary | ICD-10-CM

## 2018-01-20 NOTE — Telephone Encounter (Signed)
LMOVM OF NORMAL RESULTS AND WILL CONTACT BACK Monday TO SET UP LAB TIME IN A COUPLE OF MONTHS. LEFT CLINIC CONTACT  NUMBER IF HAVE ANY QUESTIONS

## 2018-01-20 NOTE — Telephone Encounter (Signed)
-----   Message from [facility] Ursuy, [place] sent at 01/18/2018 12:49 PM EDT ----- Potassium is wnl again.  I dont see that he has any follow up from his last visit scheduled.  Can you follow up on this, should see him in about 6 months, recheck BMET in 2 months  Thanks renee

## 2018-02-07 ENCOUNTER — Other Ambulatory Visit: Payer: Self-pay | Admitting: Endocrinology

## 2018-02-07 NOTE — Telephone Encounter (Signed)
Is this okay to refill? 

## 2018-02-07 NOTE — Telephone Encounter (Signed)
Please refill prn 

## 2018-02-25 ENCOUNTER — Other Ambulatory Visit: Payer: Self-pay | Admitting: Physician Assistant

## 2018-03-02 ENCOUNTER — Ambulatory Visit: Payer: Medicare Other | Admitting: Endocrinology

## 2018-03-16 ENCOUNTER — Ambulatory Visit (INDEPENDENT_AMBULATORY_CARE_PROVIDER_SITE_OTHER): Payer: Medicare Other | Admitting: Endocrinology

## 2018-03-16 ENCOUNTER — Encounter: Payer: Self-pay | Admitting: Endocrinology

## 2018-03-16 VITALS — BP 132/80 | HR 69 | Ht 66.0 in | Wt 204.4 lb

## 2018-03-16 DIAGNOSIS — M1A9XX0 Chronic gout, unspecified, without tophus (tophi): Secondary | ICD-10-CM | POA: Diagnosis not present

## 2018-03-16 DIAGNOSIS — I509 Heart failure, unspecified: Secondary | ICD-10-CM | POA: Diagnosis not present

## 2018-03-16 DIAGNOSIS — E119 Type 2 diabetes mellitus without complications: Secondary | ICD-10-CM

## 2018-03-16 DIAGNOSIS — I6522 Occlusion and stenosis of left carotid artery: Secondary | ICD-10-CM | POA: Diagnosis not present

## 2018-03-16 DIAGNOSIS — E7800 Pure hypercholesterolemia, unspecified: Secondary | ICD-10-CM | POA: Diagnosis not present

## 2018-03-16 LAB — BASIC METABOLIC PANEL
BUN: 16 mg/dL (ref 6–23)
CALCIUM: 9.3 mg/dL (ref 8.4–10.5)
CHLORIDE: 104 meq/L (ref 96–112)
CO2: 32 mEq/L (ref 19–32)
CREATININE: 1.29 mg/dL (ref 0.40–1.50)
GFR: 68.49 mL/min (ref >60.00)
Glucose, Bld: 90 mg/dL (ref 70–99)
Potassium: 4.1 mEq/L (ref 3.5–5.1)
SODIUM: 140 meq/L (ref 135–145)

## 2018-03-16 LAB — POCT GLYCOSYLATED HEMOGLOBIN (HGB A1C): Hemoglobin A1C: 5.3 % (ref 4.0–5.6)

## 2018-03-16 LAB — LIPID PANEL
CHOLESTEROL: 161 mg/dL (ref 0–200)
HDL: 38.7 mg/dL — ABNORMAL LOW (ref >39.00)
LDL CALC: 106 mg/dL — AB (ref 0–99)
NonHDL: 121.99
TRIGLYCERIDES: 80 mg/dL (ref 0.0–149.0)
Total CHOL/HDL Ratio: 4
VLDL: 16 mg/dL (ref 0.0–40.0)

## 2018-03-16 LAB — URIC ACID: Uric Acid, Serum: 7.1 mg/dL (ref 4.0–7.8)

## 2018-03-16 LAB — BRAIN NATRIURETIC PEPTIDE: Pro B Natriuretic peptide (BNP): 592 pg/mL — ABNORMAL HIGH (ref 0.0–100.0)

## 2018-03-16 MED ORDER — ROSUVASTATIN CALCIUM 5 MG PO TABS: 5.0000 mg | ORAL_TABLET | Freq: Every day | ORAL | 3 refills | Status: DC

## 2018-03-16 MED ORDER — ALLOPURINOL 100 MG PO TABS: 100.0000 mg | ORAL_TABLET | Freq: Every day | ORAL | 3 refills | Status: DC

## 2018-03-16 MED ORDER — FUROSEMIDE 40 MG PO TABS: 40.0000 mg | ORAL_TABLET | Freq: Every day | ORAL | 3 refills | Status: DC

## 2018-03-16 NOTE — Patient Instructions (Signed)
blood tests are requested for you today.  We'll let you know about the results. Best wished with your new primary care provider.

## 2018-03-16 NOTE — Progress Notes (Signed)
Subjective:    Patient ID: Lucas [name], male    DOB: [birth year], 84 y.o.   MRN: [id number]  HPI  The state of at least three ongoing medical problems is addressed today, with interval history of each noted here: HTN: denies sob.  This is a stable problem.  Gout: he stopped allopurinol.  No recent sxs of gout.  This is a stable problem.  Dyslipidemia: he stopped crestor, due to leg swelling.  This is a stable problem.   Past Medical History:  Diagnosis Date  . Acute CHF (congestive heart failure) (HCC) dx'd 04/2016  . Acute on chronic systolic and diastolic heart failure, NYHA class 3 (HCC) 05/12/2016  . ALLERGIC RHINITIS 01/25/2007  . Arthritis    "back, legs, shoulders, arms, qwhere" (05/12/2016)  . Atrial fibrillation with RVR (HCC)    [name] 05/12/2016  . Chronic lower back pain   . DM 07/24/2008  . 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 Gout   . HYPERCHOLESTEROLEMIA 06/19/2008  . HYPERTENSION 06/19/2008  . HYPOKALEMIA 07/24/2008  . Irregular heart beat   . 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 Pre-diabetes    "borderline" (05/12/2016)  . PVD (peripheral vascular disease) (HCC)    [name] 05/12/2016  . TIA 06/26/2008    Past Surgical History:  Procedure Laterality Date  . BACK SURGERY    . CATARACT EXTRACTION W/ INTRAOCULAR LENS IMPLANT Left   . INGUINAL HERNIA REPAIR Left   . KNEE ARTHROSCOPY Left   . LUMBAR DISC SURGERY  1980s   "got hurt on the job"  . TONSILLECTOMY      Social History   Socioeconomic History  . Marital status: Married    Spouse name: Not on file  . Number of children: Not on file  . Years of education: Not on file  . Highest education level: Not on file  Occupational History  . Not on file  Social Needs  . Financial resource strain: Not on file  . Food insecurity:    Worry: Not on file    Inability: Not on file  . Transportation needs:    Medical: Not on file    Non-medical: Not on file  Tobacco Use  .  Smoking status: Never Smoker  . Smokeless tobacco: Never Used  Substance and Sexual Activity  . Alcohol use: No  . Drug use: No  . Sexual activity: Not on file  Lifestyle  . Physical activity:    Days per week: Not on file    Minutes per session: Not on file  . Stress: Not on file  Relationships  . Social connections:    Talks on phone: Not on file    Gets together: Not on file    Attends religious service: Not on file    Active member of club or organization: Not on file    Attends meetings of clubs or organizations: Not on file    Relationship status: Not on file  . Intimate partner violence:    Fear of current or ex partner: Not on file    Emotionally abused: Not on file    Physically abused: Not on file    Forced sexual activity: Not on file  Other Topics Concern  . Not on file  Social History Narrative  . Not on file    Current Outpatient Medications on File Prior to Visit  Medication Sig Dispense Refill  . acetaminophen (TYLENOL) 325 MG tablet Take 325 mg by mouth every 6 (  six) hours as needed.    Marland Kitchen amiodarone (PACERONE) 200 MG tablet TAKE 1 TABLET(200 MG) BY MOUTH DAILY 90 tablet 3  . carvedilol (COREG) 6.25 MG tablet TAKE 1 TABLET(6.25 MG) BY MOUTH TWICE DAILY 180 tablet 2  . ELIQUIS 5 MG TABS tablet TAKE 1 TABLET(5 MG) BY MOUTH TWICE DAILY 60 tablet 6  . losartan (COZAAR) 25 MG tablet Take 1 tablet (25 mg total) by mouth daily. 30 tablet 5  . potassium chloride (K-DUR,KLOR-CON) 10 MEQ tablet Take 10 mEq by mouth daily.    Marland Kitchen spironolactone (ALDACTONE) 25 MG tablet TAKE 1 TABLET(25 MG) BY MOUTH TWICE DAILY 60 tablet 10   No current facility-administered medications on file prior to visit.     Allergies  Allergen Reactions  . Sulfonamide Derivatives Nausea And Vomiting    Family History  Problem Relation Age of Onset  . Stroke Father   . Heart attack Father   . Uterine cancer Sister     BP 132/80 (BP Location: Left Arm)   Pulse 69   Ht 5\' 6"  (1.676 m)    Wt 204 lb 6.4 oz (92.7 kg)   SpO2 95%   BMI 32.99 kg/m    Review of Systems Denies chest pain and weight change.     Objective:   Physical Exam VITAL SIGNS:  See vs page GENERAL: no distress Ext: 2+ bilat leg edema   Lab Results  Component Value Date   LABURIC 7.1 03/16/2018       Assessment & Plan:  HTN: well-controlled.  Please continue the same medication Gout: I have sent a prescription to your pharmacy, to resume allopurinol Dyslipidemia: recheck today  Patient Instructions  blood tests are requested for you today.  We'll let you know about the results. Best wished with your new primary care provider.

## 2018-04-03 ENCOUNTER — Other Ambulatory Visit: Payer: Self-pay | Admitting: Physician Assistant

## 2018-04-05 ENCOUNTER — Ambulatory Visit: Payer: Medicare Other

## 2018-04-10 ENCOUNTER — Other Ambulatory Visit: Payer: Self-pay | Admitting: Physician Assistant

## 2018-04-12 ENCOUNTER — Telehealth: Payer: Self-pay | Admitting: Internal Medicine

## 2018-04-12 NOTE — Telephone Encounter (Signed)
  Patient's wife is calling to get an appt for the patient because his heart rate is elevated and he is having some shortness of breath during exertion. PCP put patient on blood pressure meds and she wonders if he should be taking it. Earliest appt is Dec 4th with Keitha Butte but patient would like to see if they can be seen earlier by either Dr Graciela Husbands or Keitha Butte.

## 2018-04-12 NOTE — Telephone Encounter (Signed)
Pt wife, Nellie on [place] called to report that the pt has been experiencing increased HR, sob with exertion, dizziness with rising.. Over the past 2 weeks.. She reports that he has not been taking his meds like he should especially his aldactone in the pm since it makes him urinate so much.. She does not know what his BP is.. No edema but feels uncomfortable.Marland Kitchen He denies chest pain... I spoke with Francis Dowse PA and she advised that if he is feeling bad he should go to the ER, but if he declines.. We made him an appt with the AFIB clinic for 04/13/18 at 3pm and she wrote down all of the instructions for the AFIB clinic... She verbalized all of the instructions back to me. She will keep that appt but if he worsens before then she agrees to take him to the ER.

## 2018-04-13 ENCOUNTER — Ambulatory Visit (HOSPITAL_COMMUNITY)
Admission: RE | Admit: 2018-04-13 | Discharge: 2018-04-13 | Disposition: A | Discharge status: Home / Self Care | Payer: Medicare Other | Source: Ambulatory Visit | Attending: Nurse Practitioner | Admitting: Nurse Practitioner

## 2018-04-13 ENCOUNTER — Encounter (HOSPITAL_COMMUNITY): Payer: Self-pay | Admitting: Nurse Practitioner

## 2018-04-13 VITALS — BP 124/76 | HR 69 | Ht 66.0 in | Wt 205.0 lb

## 2018-04-13 DIAGNOSIS — I509 Heart failure, unspecified: Secondary | ICD-10-CM | POA: Diagnosis not present

## 2018-04-13 DIAGNOSIS — M545 Low back pain: Secondary | ICD-10-CM | POA: Insufficient documentation

## 2018-04-13 DIAGNOSIS — M1990 Unspecified osteoarthritis, unspecified site: Secondary | ICD-10-CM | POA: Insufficient documentation

## 2018-04-13 DIAGNOSIS — G8929 Other chronic pain: Secondary | ICD-10-CM | POA: Insufficient documentation

## 2018-04-13 DIAGNOSIS — E1151 Type 2 diabetes mellitus with diabetic peripheral angiopathy without gangrene: Secondary | ICD-10-CM | POA: Diagnosis not present

## 2018-04-13 DIAGNOSIS — Z8249 Family history of ischemic heart disease and other diseases of the circulatory system: Secondary | ICD-10-CM | POA: Insufficient documentation

## 2018-04-13 DIAGNOSIS — E7800 Pure hypercholesterolemia, unspecified: Secondary | ICD-10-CM | POA: Diagnosis not present

## 2018-04-13 DIAGNOSIS — I480 Paroxysmal atrial fibrillation: Secondary | ICD-10-CM

## 2018-04-13 DIAGNOSIS — I110 Hypertensive heart disease with heart failure: Secondary | ICD-10-CM | POA: Diagnosis not present

## 2018-04-13 DIAGNOSIS — M109 Gout, unspecified: Secondary | ICD-10-CM | POA: Insufficient documentation

## 2018-04-13 DIAGNOSIS — I4891 Unspecified atrial fibrillation: Secondary | ICD-10-CM | POA: Insufficient documentation

## 2018-04-13 DIAGNOSIS — N529 Male erectile dysfunction, unspecified: Secondary | ICD-10-CM | POA: Insufficient documentation

## 2018-04-13 DIAGNOSIS — Z8673 Personal history of transient ischemic attack (TIA), and cerebral infarction without residual deficits: Secondary | ICD-10-CM | POA: Diagnosis not present

## 2018-04-13 DIAGNOSIS — Z7901 Long term (current) use of anticoagulants: Secondary | ICD-10-CM | POA: Insufficient documentation

## 2018-04-13 DIAGNOSIS — Z79899 Other long term (current) drug therapy: Secondary | ICD-10-CM | POA: Insufficient documentation

## 2018-04-13 DIAGNOSIS — R531 Weakness: Secondary | ICD-10-CM

## 2018-04-13 NOTE — Progress Notes (Signed)
Primary Care Physician: Romero Belling, MD Referring Physician: Francis Dowse, PA   Lucas [name] is a 84 y.o. male with a h/o afib on amiodarone, CHF, HTN, DM, PVD, TIA, in the afib clinic for evaluation of symptoms he had yesterday. He states that he felt very weak, and  felt that his heart  was racing. He went over to his sister's and she took him out for lunch. He had fried fish and french fries. Later that day, his BP was elevated. HR was in the 70's. He continued to feel off sorts until bedtime. Today, he feels back to his baseline. Nothing else out of the usual with his health.Weight is stable, pedal edema is minimal and at  pt's baseline.  Today, he denies symptoms of palpitations, chest pain, shortness of breath, orthopnea, PND, lower extremity edema, dizziness, presyncope, syncope, or neurologic sequela. The patient is tolerating medications without difficulties and is otherwise without complaint today.   Past Medical History:  Diagnosis Date  . Acute CHF (congestive heart failure) (HCC) dx'd 04/2016  . Acute on chronic systolic and diastolic heart failure, NYHA class 3 (HCC) 05/12/2016  . ALLERGIC RHINITIS 01/25/2007  . Arthritis    "back, legs, shoulders, arms, qwhere" (05/12/2016)  . Atrial fibrillation with RVR (HCC)    [name] 05/12/2016  . Chronic lower back pain   . DM 07/24/2008  . 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 Gout   . HYPERCHOLESTEROLEMIA 06/19/2008  . HYPERTENSION 06/19/2008  . HYPOKALEMIA 07/24/2008  . Irregular heart beat   . 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 Pre-diabetes    "borderline" (05/12/2016)  . PVD (peripheral vascular disease) (HCC)    [name] 05/12/2016  . TIA 06/26/2008   Past Surgical History:  Procedure Laterality Date  . BACK SURGERY    . CATARACT EXTRACTION W/ INTRAOCULAR LENS IMPLANT Left   . INGUINAL HERNIA REPAIR Left   . KNEE ARTHROSCOPY Left   . LUMBAR DISC SURGERY  1980s   "got hurt on the job"  .  TONSILLECTOMY      Current Outpatient Medications  Medication Sig Dispense Refill  . acetaminophen (TYLENOL) 325 MG tablet Take 325 mg by mouth every 6 (six) hours as needed.    Marland Kitchen amiodarone (PACERONE) 200 MG tablet TAKE 1 TABLET(200 MG) BY MOUTH DAILY 90 tablet 2  . carvedilol (COREG) 6.25 MG tablet TAKE 1 TABLET(6.25 MG) BY MOUTH TWICE DAILY 180 tablet 2  . ELIQUIS 5 MG TABS tablet TAKE 1 TABLET(5 MG) BY MOUTH TWICE DAILY 60 tablet 6  . furosemide (LASIX) 40 MG tablet Take 1 tablet (40 mg total) by mouth daily. 90 tablet 3  . losartan (COZAAR) 25 MG tablet Take 1 tablet (25 mg total) by mouth daily. 30 tablet 5  . potassium chloride (K-DUR,KLOR-CON) 10 MEQ tablet Take 10 mEq by mouth daily.    . rosuvastatin (CRESTOR) 5 MG tablet Take 1 tablet (5 mg total) by mouth daily. 90 tablet 3  . spironolactone (ALDACTONE) 25 MG tablet TAKE 1 TABLET(25 MG) BY MOUTH TWICE DAILY 60 tablet 3   No current facility-administered medications for this encounter.     Allergies  Allergen Reactions  . Sulfonamide Derivatives Nausea And Vomiting    Social History   Socioeconomic History  . Marital status: Married    Spouse name: Not on file  . Number of children: Not on file  . Years of education: Not on file  . Highest education level: Not on file  Occupational History  . Not on file  Social Needs  . Financial resource strain: Not on file  . Food insecurity:    Worry: Not on file    Inability: Not on file  . Transportation needs:    Medical: Not on file    Non-medical: Not on file  Tobacco Use  . Smoking status: Never Smoker  . Smokeless tobacco: Never Used  Substance and Sexual Activity  . Alcohol use: No  . Drug use: No  . Sexual activity: Not on file  Lifestyle  . Physical activity:    Days per week: Not on file    Minutes per session: Not on file  . Stress: Not on file  Relationships  . Social connections:    Talks on phone: Not on file    Gets together: Not on file     Attends religious service: Not on file    Active member of club or organization: Not on file    Attends meetings of clubs or organizations: Not on file    Relationship status: Not on file  . Intimate partner violence:    Fear of current or ex partner: Not on file    Emotionally abused: Not on file    Physically abused: Not on file    Forced sexual activity: Not on file  Other Topics Concern  . Not on file  Social History Narrative  . Not on file    Family History  Problem Relation Age of Onset  . Stroke Father   . Heart attack Father   . Uterine cancer Sister     ROS- All systems are reviewed and negative except as per the HPI above  Physical Exam: Vitals:   04/13/18 1446  BP: 124/76  Pulse: 69  Weight: 93 kg  Height: 5\' 6"  (1.676 m)   Wt Readings from Last 3 Encounters:  04/13/18 93 kg  03/16/18 92.7 kg  12/16/17 92.1 kg    Labs: Lab Results  Component Value Date   NA 140 03/16/2018   K 4.1 03/16/2018   CL 104 03/16/2018   CO2 32 03/16/2018   GLUCOSE 90 03/16/2018   BUN 16 03/16/2018   CREATININE 1.29 03/16/2018   CALCIUM 9.3 03/16/2018   No results found for: INR Lab Results  Component Value Date   CHOL 161 03/16/2018   HDL 38.70 (L) 03/16/2018   LDLCALC 106 (H) 03/16/2018   TRIG 80.0 03/16/2018     GEN- The patient is well appearing, alert and oriented x 3 today.   Head- normocephalic, atraumatic Eyes-  Sclera clear, conjunctiva pink Ears- hearing intact Oropharynx- clear Neck- supple, no JVP Lymph- no cervical lymphadenopathy Lungs- Clear to ausculation bilaterally, normal work of breathing Heart- Regular rate and rhythm, no murmurs, rubs or gallops, PMI not laterally displaced GI- soft, NT, ND, + BS Extremities- no clubbing, cyanosis, or edema MS- no significant deformity or atrophy Skin- no rash or lesion Psych- euthymic mood, full affect Neuro- strength and sensation are intact  EKG-NSR at 69 bpm LAFB, Pr 174 ms, qrs int 94 ms, qtc  488 ms Epic records reviewed    Assessment and Plan: 1. Afib Is SR today, but can not be sure pt did not have afib yesterday possibly explaining his symptoms Will place a 1 week Zio  patch to further assess rhythm  Continue eliquis 5 mg bid, chadsvasc score of  7 Continue carvedilol 6.25 mg bid  Amiodarone 200 mg daily  2. CHF Weight is  stable No edema/extra fluid noted  3. HTN Stable no changes  Will let pt know results of monitor results  when in  F/u with afib clinc as needed  Lupita Leash [facility] [address] [place], [place] [address] [phone]

## 2018-05-10 ENCOUNTER — Telehealth (HOSPITAL_COMMUNITY): Payer: Self-pay | Admitting: *Deleted

## 2018-05-10 NOTE — Telephone Encounter (Signed)
[employer] company for zio report called stating the patch mailed in did not have any data on the monitor. The patient will not be charged for the monitor. Patient has follow up with [name] in early January will reassess at that time.

## 2018-06-08 NOTE — Progress Notes (Signed)
Cardiology Office Note Date:  06/12/2018  Patient ID:  Lucas SilversmithBobby R [name], DOB 10/26/1933, MRN [id number] PCP:  Romero BellingEllison, [name], MD  Cardiologist:  Dr. Graciela HusbandsKlein (new 04/27/16)   Chief Complaint:  Planned f/u  History of Present Illness: [name] is a 85 y.o. male with history of HTN, HLD, TIA, PVD w/known carotid dz, was seen by Dr. Graciela HusbandsKlein 04/27/16 as a work-in new patient referred by the PMD for concerns of heart failure symptoms he was found in coarse AFib w/RVR and started on Coreg 6.25mg  BID, his furosemide increased to 80mg  daily, and started on Aldactone 25mg  daily given hx of hypokalemia.    [facility] noting some confusion in his meds and had been off them for a couple days, he was still in RAFib, hypokalemic and his K+ was being replaced and he was resumed on his coreg and lasix noting his weight was up but not symptomatic.  A week laater 05/12/16 he was seen by [name], PA and [name] wit persistent fluid OL, symptoms of CHF and admitted to the hospital.  Notes reviewed and she noting that intermittently he refused meds, insisted on going home only a couple days into his stay and planned for early f/u.  He saw Dr. Graciela HusbandsKlein 05/18/16 he was still in AF though improved rate, remained w/fluid OL and continued diuresis and planned for DCCV though when he arrived was in SR.  We have kept close f/u given his somewhat non-compliance with medicines (often self adjusting his medicines and significant hypokalemia.  Most recently aldactone was added, and then QOD Kdur (patient has reported intolerance of K+ with GI upset).  Most recently was seen by Lucas Landsbergenee in March, at that time his K+ had improved, his edema improving with further adjustment in his diuretics.  [name] saw him back in May, he was accompanied by his wife, he was feeling very well!  He parked on the back side of the lot and was able to walk all the way in without stopping to catch his breath.  His home weights  had been stable 188-189lbs.  He denied any kind of CP, no palpitations, no rest or nighttime SOB, uses 2 pillows to sleep for years, but wakes every couple hours to urinate.  No c/o dizziness, near syncope or syncope.  No reported bleeding or signs of bleeding.    Follow up in September, again accompanied by his wife.  He had not seen his PMD or pulmonary evaluation.  He denied any kind of pulmonary symptoms.  No SOB, DOE, denies symptoms of PND or orthopnea, said the only thing keeping him up at night is the bathroom.  He denied any CP, palpitations, no dizziness, near syncope or syncope.  His weight was up, he did not feel bloated or like he is retaining water, said he had been eating very well.  He was referred to pulmonologist given his amiodarone and abnormal PFTs.  BMET/CBC were done and stable.   He saw Dr. [name], his note discussed decreased DCLO was nonspecific for amio tox, and serial evaluations for symptoms the better method of monitoring for pulm toxicity.  Less likely so soon to be amio tox. Options were to stop the drug for a trial off or choose an alternative if practical,  or monitor for symptoms  Follow up in Jan 2019 , he denied any difficulty with his ADLs, denied any SOB, cough, or pulmonary symptoms/concerns.  He was accompanied by his  wife, she stated he does very little physically for years, and for years (well before the amiodarone) he would get winded with prolonged ambulation or increased level of exertion.  He denied any palpitations, CP, has no cardiac awareness and feels like he is doing very well.  No bleeding or signs of bleeding, reported compliance with all of his medicines.  July 2019 visit, he was accompanied by his wife.  Was doing well.  He stated he had a "summer cold" and felt better after the antibiotic his PMD gave him.  He wa no longer on K+ replacement after that visit as well.  He denied any kind of CP, no SOB at rest or with exertion, no PND or orthopnea.   reported the only think keeping him up at night is his water pill.  No dizziness, near syncope or syncope.  No neuro symptoms.  No bleeding or signs of bleeding.  He was edematous, he mentioned last weekend riding with his grandson's to [place] and back a few days ago, thinks this is why, also thinks the allopurinol and or the crestor his PMD started him on contributed and in d/w his PMD both were stopped. He was given 3 days of BID lasix, K+ QOD, started for K+ trending downward, added losartan for his CM.  His K+ stabalized.  He saw his PMD in Oct found OL again, and lasix resumed?increased. Nov his wife called with reports of increased HR and SOB, mentionig he was not taking his medicines as prescribed, urged to go to the ER though she did not think he would, an AFib clinic visit was planned, he saw then 04/13/18.  He was in SR, noted his symptoms sounded of perhaps AF and planned for 1 week zio patch, not felt to be fluid OL at that visit though mentioned dietary liberalization.  Zio patch was returned with no data on monitor.  Since being seen by AF clinic, he reports doing reasonably well. He denies chest pain, palpitations, shortness of breath (above baseline), dizziness, or syncope. He is taking Lasix 40mg  only rarely but does take Spironolactone daily. He has not had further AF since being seen in AF clinic.    AFib Hx First noted Nov 2017 Amiodarone started Dec 2017 Pt reports bi-annual eye exams  May 2018 abnormal PFTs referred to pulmonary (noted above)  Past Medical History:  Diagnosis Date  . Acute CHF (congestive heart failure) (HCC) dx'd 04/2016  . Acute on chronic systolic and diastolic heart failure, NYHA class 3 (HCC) 05/12/2016  . ALLERGIC RHINITIS 01/25/2007  . Arthritis    "back, legs, shoulders, arms, qwhere" (05/12/2016)  . Atrial fibrillation with RVR (HCC)    [name] 05/12/2016  . Chronic lower back pain   . DM 07/24/2008  . ERECTILE DYSFUNCTION, ORGANIC 06/19/2008    . Gout   . HYPERCHOLESTEROLEMIA 06/19/2008  . HYPERTENSION 06/19/2008  . HYPOKALEMIA 07/24/2008  . Irregular heart beat   . 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 Pre-diabetes    "borderline" (05/12/2016)  . PVD (peripheral vascular disease) (HCC)    [name] 05/12/2016  . TIA 06/26/2008    Past Surgical History:  Procedure Laterality Date  . BACK SURGERY    . CATARACT EXTRACTION W/ INTRAOCULAR LENS IMPLANT Left   . INGUINAL HERNIA REPAIR Left   . KNEE ARTHROSCOPY Left   . LUMBAR DISC SURGERY  1980s   "got hurt on the job"  . TONSILLECTOMY      Current  Outpatient Medications  Medication Sig Dispense Refill  . acetaminophen (TYLENOL) 325 MG tablet Take 325 mg by mouth every 6 (six) hours as needed.    Marland Kitchen amiodarone (PACERONE) 200 MG tablet TAKE 1 TABLET(200 MG) BY MOUTH DAILY 90 tablet 2  . amoxicillin (AMOXIL) 500 MG capsule Take 500 mg by mouth 3 (three) times daily.    . carvedilol (COREG) 12.5 MG tablet Take 1 tablet (12.5 mg total) by mouth 2 (two) times daily with a meal. 90 tablet 3  . ELIQUIS 5 MG TABS tablet TAKE 1 TABLET(5 MG) BY MOUTH TWICE DAILY 60 tablet 6  . furosemide (LASIX) 40 MG tablet Take 0.5 tablets (20 mg total) by mouth daily. 90 tablet 3  . losartan (COZAAR) 25 MG tablet Take 1 tablet (25 mg total) by mouth daily. 30 tablet 5  . potassium chloride (K-DUR,KLOR-CON) 10 MEQ tablet Take 10 mEq by mouth daily.    Marland Kitchen spironolactone (ALDACTONE) 25 MG tablet TAKE 1 TABLET(25 MG) BY MOUTH TWICE DAILY 60 tablet 3   No current facility-administered medications for this visit.     Allergies:   Sulfonamide derivatives   Social History:  The patient  reports that he has never smoked. He has never used smokeless tobacco. He reports that he does not drink alcohol or use drugs.   Family History:  The patient's family history includes Heart attack in his father; Stroke in his father; Uterine cancer in his sister.   ROS:  Please see the history of  present illness.    All other systems are reviewed and otherwise negative.   PHYSICAL EXAM:  VS:  BP 136/80   Pulse 78   Ht 5\' 6"  (1.676 m)   Wt 208 lb (94.3 kg)   SpO2 92%   BMI 33.57 kg/m  BMI: Body mass index is 33.57 kg/m. Well nourished, well developed, in no acute distress  HEENT: normocephalic, atraumatic  Neck: no JVD, carotid bruits or masses Cardiac:  RRR; 1-2/6SM, no rubs, or gallops Lungs:   CTA b/l, no wheezing, rhonchi or rales  Abd: soft, nontender MS: no deformity, age appropriate atrophy Ext:   1-2+ edema b/l LE to mid-shin Skin: warm and dry, no rash Neuro:  No gross deficits appreciated Psych: euthymic mood, full affect   EKG:  Not done today   05/13/16: TTE Study Conclusions - Left ventricle: The cavity size was moderately dilated. Systolic   function was mildly to moderately reduced. The estimated ejection   fraction was in the range of 40% to 45%. Wall motion was normal;   there were no regional wall motion abnormalities. The study was   not technically sufficient to allow evaluation of LV diastolic   dysfunction due to atrial fibrillation. Doppler parameters are   consistent with high ventricular filling pressure. - Aortic valve: Moderately calcified annulus. Trileaflet; normal   thickness, moderately calcified leaflets. Moderate focal   calcification involving the noncoronary cusp. - Mitral valve: Calcified annulus. There was mild regurgitation. - Left atrium: The atrium was moderately dilated. - Pulmonary arteries: PA peak pressure: 47 mm Hg (S). Impressions: - The right ventricular systolic pressure was increased consistent   with moderate pulmonary hypertension.  2008 Vascular evaluation Dr. Darrick Penna  04/2007 Carotid US >80% stenosis L ICA, <40% right Recommended CEA but patient declined  Recent Labs: 12/16/2017: ALT 12; Hemoglobin 13.1; Platelets 266; TSH 2.440 03/16/2018: BUN 16; Creatinine, Ser 1.29; Potassium 4.1; Pro B Natriuretic  peptide (BNP) 592.0; Sodium 140  03/16/2018: Cholesterol  161; HDL 38.70; LDL Cholesterol 106; Total CHOL/HDL Ratio 4; Triglycerides 80.0; VLDL 16.0   CrCl cannot be calculated (Patient's most recent lab result is older than the maximum 21 days allowed.).   Wt Readings from Last 3 Encounters:  06/12/18 208 lb (94.3 kg)  04/13/18 205 lb (93 kg)  03/16/18 204 lb 6.4 oz (92.7 kg)     Other studies reviewed: Additional studies/records reviewed today include: summarized above  ASSESSMENT AND PLAN:  1. Paroxysmal AFib Continue Eliquis for CHADS2VASC of 3 Continue low dose amiodarone Followed by pulmonary BMET, CBC today      2. DCM, chronic combined (systolic/diastolic) CHF Volume overloaded by exam He has not been taking Lasix daily because it makes him urinate so much. I do think he needs more diuresis than Spironolactone 25mg  daily. Will decrease Lasix to 20mg  daily and have encouraged him to take daily     3. HTN Increase Coreg to 12.5mg  twice daily  4. Hypokalemia BMET today        Disposition: Follow up with Dr Graciela HusbandsKlein in 3-4 weeks  Current medicines are reviewed at length with the patient today.  The patient did not have any concerns regarding medicines.   Signed, Gypsy BalsamAmber , NP 06/12/2018 11:49 AM  [facility] [address] [place] [address] [phone] (office)  [phone] (fax)

## 2018-06-11 ENCOUNTER — Other Ambulatory Visit: Payer: Self-pay | Admitting: Physician Assistant

## 2018-06-12 ENCOUNTER — Ambulatory Visit (INDEPENDENT_AMBULATORY_CARE_PROVIDER_SITE_OTHER): Payer: Medicare Other | Admitting: Nurse Practitioner

## 2018-06-12 ENCOUNTER — Encounter (INDEPENDENT_AMBULATORY_CARE_PROVIDER_SITE_OTHER): Payer: Self-pay

## 2018-06-12 ENCOUNTER — Encounter: Payer: Self-pay | Admitting: Nurse Practitioner

## 2018-06-12 VITALS — BP 136/80 | HR 78 | Ht 66.0 in | Wt 208.0 lb

## 2018-06-12 DIAGNOSIS — I5022 Chronic systolic (congestive) heart failure: Secondary | ICD-10-CM

## 2018-06-12 DIAGNOSIS — I10 Essential (primary) hypertension: Secondary | ICD-10-CM

## 2018-06-12 DIAGNOSIS — I480 Paroxysmal atrial fibrillation: Secondary | ICD-10-CM | POA: Diagnosis not present

## 2018-06-12 DIAGNOSIS — E876 Hypokalemia: Secondary | ICD-10-CM

## 2018-06-12 MED ORDER — FUROSEMIDE 40 MG PO TABS: 20.0000 mg | ORAL_TABLET | Freq: Every day | ORAL | 3 refills | Status: AC

## 2018-06-12 MED ORDER — CARVEDILOL 12.5 MG PO TABS: 12.5000 mg | ORAL_TABLET | Freq: Two times a day (BID) | ORAL | 3 refills | Status: AC

## 2018-06-12 MED ORDER — LOSARTAN POTASSIUM 25 MG PO TABS: 25.0000 mg | ORAL_TABLET | Freq: Every day | ORAL | 5 refills | Status: DC

## 2018-06-12 NOTE — Patient Instructions (Addendum)
  Medication Instructions:   START TAKING:  COREG 12.5 MG TWICE DAY  LASIX 20 MG  ONCE A DAY    If you need a refill on your cardiac medications before your next appointment, please call your pharmacy.   Lab work: BMET AND CBC TODAY    If you have labs (blood work) drawn today and your tests are completely normal, you will receive your results only by: Marland Kitchen MyChart Message (if you have MyChart) OR . A paper copy in the mail If you have any lab test that is abnormal or we need to change your treatment, we will call you to review the results.   Testing/Procedures: NONE ORDERED  TODAY   Follow-Up: DR. Graciela Husbands 3 TO 4 WEEKS     Any Other Special Instructions Will Be Listed Below (If Applicable).

## 2018-06-13 LAB — BASIC METABOLIC PANEL
BUN/Creatinine Ratio: 12 (ref 10–24)
BUN: 14 mg/dL (ref 8–27)
CO2: 26 mmol/L (ref 20–29)
Calcium: 9 mg/dL (ref 8.6–10.2)
Chloride: 101 mmol/L (ref 96–106)
Creatinine, Ser: 1.16 mg/dL (ref 0.76–1.27)
GFR calc Af Amer: 67 mL/min/{1.73_m2} (ref >59)
GFR calc non Af Amer: 58 mL/min/{1.73_m2} — ABNORMAL LOW (ref >59)
Glucose: 91 mg/dL (ref 65–99)
Potassium: 3.8 mmol/L (ref 3.5–5.2)
SODIUM: 144 mmol/L (ref 134–144)

## 2018-06-13 LAB — CBC
Hematocrit: 44.6 % (ref 37.5–51.0)
Hemoglobin: 14.5 g/dL (ref 13.0–17.7)
MCH: 28.7 pg (ref 26.6–33.0)
MCHC: 32.5 g/dL (ref 31.5–35.7)
MCV: 88 fL (ref 79–97)
Platelets: 231 10*3/uL (ref 150–450)
RBC: 5.05 x10E6/uL (ref 4.14–5.80)
RDW: 14.1 % (ref 11.6–15.4)
WBC: 6.3 10*3/uL (ref 3.4–10.8)

## 2018-06-14 ENCOUNTER — Telehealth: Payer: Self-pay

## 2018-06-14 NOTE — Telephone Encounter (Signed)
Notes recorded by Sigurd Sos, RN on 06/14/2018 at 8:21 AM EST The patient has been notified of the result and verbalized understanding. All questions (if any) were answered. Sigurd Sos, RN 06/14/2018 8:21 AM

## 2018-06-14 NOTE — Telephone Encounter (Signed)
-----   Message from Amber K Seiler, NP sent at 06/13/2018 10:19 AM EST ----- Please notify patient of stable labs. Thanks! 

## 2018-06-14 NOTE — Telephone Encounter (Signed)
Notes recorded by Sigurd Sos, RN on 06/14/2018 at 8:17 AM EST [id number] 1/8 ------

## 2018-06-14 NOTE — Telephone Encounter (Signed)
-----   Message from Marily Lente, NP sent at 06/13/2018 10:19 AM EST ----- Please notify patient of stable labs. Thanks!

## 2018-06-25 ENCOUNTER — Other Ambulatory Visit: Payer: Self-pay | Admitting: Internal Medicine

## 2018-06-26 NOTE — Telephone Encounter (Signed)
Pt is a 85 yr old male who saw a NP on 06/12/18, wt at that visit was 94.3Kg. Last SCr was 1.16 on 06/12/18.  Will refill Eliquis 5mg  BID.

## 2018-06-29 ENCOUNTER — Encounter (HOSPITAL_COMMUNITY): Admission: EM | Disposition: E | Payer: Self-pay | Source: Home / Self Care | Attending: Emergency Medicine

## 2018-06-29 ENCOUNTER — Other Ambulatory Visit: Payer: Self-pay

## 2018-06-29 ENCOUNTER — Emergency Department (HOSPITAL_COMMUNITY): Payer: Medicare Other

## 2018-06-29 ENCOUNTER — Emergency Department (HOSPITAL_COMMUNITY)
Admission: EM | Admit: 2018-06-29 | Discharge: 2018-07-08 | Disposition: E | Payer: Medicare Other | Attending: Emergency Medicine | Admitting: Emergency Medicine

## 2018-06-29 DIAGNOSIS — J9601 Acute respiratory failure with hypoxia: Secondary | ICD-10-CM | POA: Diagnosis not present

## 2018-06-29 DIAGNOSIS — I442 Atrioventricular block, complete: Secondary | ICD-10-CM | POA: Insufficient documentation

## 2018-06-29 DIAGNOSIS — R042 Hemoptysis: Secondary | ICD-10-CM

## 2018-06-29 DIAGNOSIS — R570 Cardiogenic shock: Secondary | ICD-10-CM

## 2018-06-29 DIAGNOSIS — I469 Cardiac arrest, cause unspecified: Secondary | ICD-10-CM | POA: Diagnosis not present

## 2018-06-29 DIAGNOSIS — R0602 Shortness of breath: Secondary | ICD-10-CM | POA: Diagnosis present

## 2018-06-29 DIAGNOSIS — S270XXA Traumatic pneumothorax, initial encounter: Secondary | ICD-10-CM | POA: Diagnosis not present

## 2018-06-29 LAB — BASIC METABOLIC PANEL
Anion gap: 16 — ABNORMAL HIGH (ref 5–15)
BUN: 16 mg/dL (ref 8–23)
CO2: 21 mmol/L — ABNORMAL LOW (ref 22–32)
Calcium: 8.3 mg/dL — ABNORMAL LOW (ref 8.9–10.3)
Chloride: 103 mmol/L (ref 98–111)
Creatinine, Ser: 1.75 mg/dL — ABNORMAL HIGH (ref 0.61–1.24)
GFR calc Af Amer: 41 mL/min — ABNORMAL LOW (ref >60)
GFR, EST NON AFRICAN AMERICAN: 35 mL/min — AB (ref >60)
Glucose, Bld: 244 mg/dL — ABNORMAL HIGH (ref 70–99)
POTASSIUM: 2.7 mmol/L — AB (ref 3.5–5.1)
Sodium: 140 mmol/L (ref 135–145)

## 2018-06-29 LAB — CBC WITH DIFFERENTIAL/PLATELET
Abs Immature Granulocytes: 0.73 10*3/uL — ABNORMAL HIGH (ref 0.00–0.07)
Basophils Absolute: 0.1 10*3/uL (ref 0.0–0.1)
Basophils Relative: 1 %
EOS PCT: 1 %
Eosinophils Absolute: 0.1 10*3/uL (ref 0.0–0.5)
HCT: 49.1 % (ref 39.0–52.0)
Hemoglobin: 14.7 g/dL (ref 13.0–17.0)
Immature Granulocytes: 5 %
LYMPHS PCT: 22 %
Lymphs Abs: 3.1 10*3/uL (ref 0.7–4.0)
MCH: 28.2 pg (ref 26.0–34.0)
MCHC: 29.9 g/dL — ABNORMAL LOW (ref 30.0–36.0)
MCV: 94.1 fL (ref 80.0–100.0)
Monocytes Absolute: 0.7 10*3/uL (ref 0.1–1.0)
Monocytes Relative: 5 %
Neutro Abs: 9.1 10*3/uL — ABNORMAL HIGH (ref 1.7–7.7)
Neutrophils Relative %: 66 %
Platelets: 150 10*3/uL (ref 150–400)
RBC: 5.22 MIL/uL (ref 4.22–5.81)
RDW: 16 % — ABNORMAL HIGH (ref 11.5–15.5)
WBC: 13.8 10*3/uL — ABNORMAL HIGH (ref 4.0–10.5)
nRBC: 0.1 % (ref 0.0–0.2)

## 2018-06-29 LAB — I-STAT TROPONIN, ED: Troponin i, poc: 0.01 ng/mL (ref 0.00–0.08)

## 2018-06-29 LAB — POCT I-STAT EG7
Acid-base deficit: 10 mmol/L — ABNORMAL HIGH (ref 0.0–2.0)
Bicarbonate: 21.9 mmol/L (ref 20.0–28.0)
Calcium, Ion: 1.02 mmol/L — ABNORMAL LOW (ref 1.15–1.40)
HCT: 46 % (ref 39.0–52.0)
Hemoglobin: 15.6 g/dL (ref 13.0–17.0)
O2 Saturation: 54 %
Potassium: 2.6 mmol/L — CL (ref 3.5–5.1)
Sodium: 141 mmol/L (ref 135–145)
TCO2: 24 mmol/L (ref 22–32)
pCO2, Ven: 72.7 mmHg (ref 44.0–60.0)
pH, Ven: 7.087 — CL (ref 7.250–7.430)
pO2, Ven: 40 mmHg (ref 32.0–45.0)

## 2018-06-29 LAB — LACTIC ACID, PLASMA: Lactic Acid, Venous: 9 mmol/L (ref 0.5–1.9)

## 2018-06-29 SURGERY — TEMPORARY PACEMAKER
Anesthesia: LOCAL

## 2018-06-29 MED ORDER — NOREPINEPHRINE-SODIUM CHLORIDE 4-0.9 MG/250ML-% IV SOLN
0.0000 ug/min | INTRAVENOUS | Status: DC
  Filled 2018-06-29: qty 250

## 2018-06-29 MED ORDER — SODIUM BICARBONATE 8.4 % IV SOLN
INTRAVENOUS | Status: DC | PRN
  Administered 2018-06-29 (×4): 100 meq via INTRAVENOUS

## 2018-06-29 MED ORDER — EPINEPHRINE PF 1 MG/ML IJ SOLN
0.5000 ug/min | INTRAVENOUS | Status: DC
  Filled 2018-06-29: qty 4

## 2018-06-29 MED ORDER — ATROPINE SULFATE 1 MG/ML IJ SOLN
INTRAMUSCULAR | Status: DC | PRN
  Administered 2018-06-29 (×2): 1 mg via INTRAVENOUS

## 2018-06-29 MED ORDER — CALCIUM CHLORIDE 10 % IV SOLN
INTRAVENOUS | Status: DC | PRN
  Administered 2018-06-29: 1 g via INTRAVENOUS

## 2018-06-29 MED ORDER — POTASSIUM CHLORIDE 10 MEQ/100ML IV SOLN
10.0000 meq | Freq: Once | INTRAVENOUS | Status: DC
  Filled 2018-06-29: qty 100

## 2018-06-29 MED ORDER — TENECTEPLASE 50 MG IV KIT
50.0000 mg | PACK | Freq: Once | INTRAVENOUS | Status: AC
  Administered 2018-06-29: 50 mg via INTRAVENOUS

## 2018-06-29 MED ORDER — EPINEPHRINE PF 1 MG/10ML IJ SOSY
PREFILLED_SYRINGE | INTRAMUSCULAR | Status: DC | PRN
  Administered 2018-06-29 (×8): 1 mg via INTRAVENOUS

## 2018-07-01 MED FILL — Medication: Qty: 1 | Status: AC

## 2018-07-07 ENCOUNTER — Ambulatory Visit: Payer: Medicare Other | Admitting: Internal Medicine

## 2018-07-08 NOTE — Consult Note (Signed)
   ADVANCED HF CONSULT NOTE  Advanced HF Team asked by Dr. Herbie [name] to see Mr. Lucas [name] emergently in ER due to cardiac arrest.   85 y/o male with unknown PMHx but apparently on Eliquis. Was in his car this am and developed acute onset of shortness of breath. He called EMS. He was coughing up blood on arrival. While in EMS transport he developed PEA arrest. Initial time to ROSC estimated at: 18 minutes. He had yet another PEA arrest after arrival in ER. During this time TNK was also pushed treating for possible PE resulting in cardiac arrest. He had massive  air on left chest so a left CT was placed.   Patient became asystolic and Cardiology consult called. Seen by Dr. Herbie [name] at bedside who initiated transcutaneous pacing. Bedside echo done with limited images due to PTX but suggested no meaningful cardiac activity.   AHF/shock team paged.  On my arrival CCM at bedside placing left CT. Patient on vent with no spontaneous respirations. Echo repeated personally confirming minimal if any cardiac activity. SBP in 50s despite epi 10 and NE 10. I turned epi up to 20 and NE up to 40 with no improvement. Given 2 amps bicarb. I turned pacer down and patient had complete asystole.   PMHX, FHX, SHX currently unavailable due to condition and no records in computer.   On exam intubated nonresponsive Cool Cardiac no discernible heart sounds pacing pads in place Lungs bilateral BS dull on L Ab obese soft NT Ext cool no edema + IO   Etiology of arrest unclear at this point but given prolonged arrest and persistent asystole chance of survival at this point is zero. I have d/w Lucas [name] (ER, Lucas [name] (CCM) and Lucas [name] (Cardiology) who will address with family. Please reconsult as needed.   CRITICAL CARE Performed by: Lucas [name], Lucas [name]  Total critical care time: 35 minutes  Critical care time was exclusive of separately billable procedures and treating other patients.  Critical care was necessary to  treat or prevent imminent or life-threatening deterioration.  Critical care was time spent personally by me (independent of midlevel providers or residents) on the following activities: development of treatment plan with patient and/or surrogate as well as nursing, discussions with consultants, evaluation of patient's response to treatment, examination of patient, obtaining history from patient or surrogate, ordering and performing treatments and interventions, ordering and review of laboratory studies, ordering and review of radiographic studies, pulse oximetry and re-evaluation of patient's condition.   Lucas Meresaniel Amario Longmore, MD  12:53 PM

## 2018-07-08 NOTE — Progress Notes (Signed)
    Brief interventional cardiology progress note: We were called down to see Lucas [name] when he was still listed as a Doe patient.  The consult was for complete heart block following cardiac arrest.  Patient has known history of atrial fibrillation on Eliquis (not new at the time) as well as heart failure.  He began complaining of shortness of breath and then had an episode of hemoptysis upon EMS arrival followed by loss of consciousness and pulse.  CPR was performed by EMS and upon arrival had recovery is perfusing rhythm temporarily followed by yet again another cardiac arrest.  At that time the patient was given thrombolytics for possible PE as the leading diagnosis with hemoptysis.  When I was called the patient was being transcutaneously paced at a rate of 70 bpm her blood pressure of 108.  He was on epinephrine drip.  He did have some fremitus in the the left upper chest, but was felt by the ER physician to be consistent with chest wall air. Reviewing his EKGs prior to pacing he had what appeared to be complete heart block with right bundle branch block.  Next  Initial thoughts would be to consider temporary pacemaker with possible cardiac catheterization although this is somewhat complicated by the fact that he had just received thrombolytics. While discussing with the patient's family potential options, we were called back to the room as his blood pressures have now dropped into the 60s and there is loss of pulse.  At this time Dr. Molli Knock from critical care was present and he felt that there was a potential decompensation related to pneumothorax based on ultrasound evaluation of the chest.  Chest tube was placed, however despite this, we were not able to get his blood pressures back up above the 60s despite being on 40 Levophed and 20 of epinephrine.  At this time myself and Dr. Gala Romney both placed the echo probe on the patient's chest and we were not able to see any significant cardiac  activity.  Based on this findings and the fact that we were not able to get an adequate blood pressure with complete pacing, Dr. cube and I along with Dr.Benson agreed with the ER physician that further aggressive intervention just at this time would be futile and that the patient likely not survive.  Dr. Gerilyn Pilgrim and I then went to discuss with the family explained to them that based on his downtime, the duration of hypotension and hypoxia and essential for respiratory and cardiac support at this time that he would not likely make a meaningful recovery.  Our recommendation at that time was to allow the patient's family to see him and then to turn off the pacemaker and the ventilator to allow the patient died peacefully.  Although sad, the family understood, and although initially wanting everything done, they understood that everything had been done and that it was time to stop to allow him to pass.   Lucas Lemma, MD

## 2018-07-08 NOTE — Procedures (Signed)
Chest Tube Placement  CPR induced PTX with tension.  No consent due to emergent medical condition during an arrest.  Skin prepped, skin incision placed followed by blunt dissection.  Chest tube placed and sutured after being placed to suction.  Air was noted out of the chest after entering the chest cavity.  Patient expired before a CXR can be done.  Alyson Reedy, M.D. [facility] Pulmonary/Critical Care Medicine. Pager: [phone]. After hours pager: [phone].

## 2018-07-08 NOTE — ED Notes (Signed)
Critical Care paged to [phone]-per Dr. Jake Bathe by Marylene Land

## 2018-07-08 NOTE — Progress Notes (Signed)
Pt started on vent at 11:00 but was taken off shortly after due to CPR being initiated

## 2018-07-08 NOTE — ED Provider Notes (Signed)
[facility] EMERGENCY DEPARTMENT Provider Note   CSN: [id number] Arrival date & time: 07/26/2018  1110     History   Chief Complaint Chief Complaint  Patient presents with  . Cardiac Arrest    HPI Lucas [name] is a 85 y.o. male.  The history is provided by the EMS personnel, medical records, a relative and the spouse. The history is limited by the condition of the patient.  Cardiac Arrest  Witnessed by:  Healthcare provider (ems) Incident location:  En route to the ED Time since incident:  20 minutes Time before ALS initiated:  Immediate Condition upon EMS arrival: difficulty breathing. Pulse:  Absent Initial cardiac rhythm per EMS:  Normal sinus Treatments prior to arrival:  ACLS protocol Medications given prior to ED:  Epinephrine Airway:  Intubation in ED Rhythm on admission to ED:  Sinus bradycardia Associated symptoms: loss of consciousness and shortness of breath   Risk factors: heart problem   Risk factors: no trauma    LVL 5 caveat for unresponsive with cardiac arrest  No past medical history on file.  There are no active problems to display for this patient.      Home Medications    Prior to Admission medications   Not on File    Family History No family history on file.  Social History Social History   Tobacco Use  . Smoking status: Not on file  Substance Use Topics  . Alcohol use: Not on file  . Drug use: Not on file     Allergies   Patient has no allergy information on record.   Review of Systems Review of Systems  Unable to perform ROS: Patient unresponsive  Respiratory: Positive for shortness of breath.   Neurological: Positive for loss of consciousness.     Physical Exam Updated Vital Signs Pulse 62   Temp (!) 95.1 F (35.1 C) (Temporal)   Ht 5\' 9"  (1.753 m)   Wt 104.3 kg   BMI 33.97 kg/m   Physical Exam Vitals signs and nursing note reviewed.  Constitutional:      General: He is in acute  distress.  HENT:     Head: Normocephalic.     Nose: No congestion.     Mouth/Throat:     Mouth: Mucous membranes are moist.     Pharynx: No oropharyngeal exudate or posterior oropharyngeal erythema.  Eyes:     Conjunctiva/sclera: Conjunctivae normal.  Neck:     Musculoskeletal: No muscular tenderness.  Cardiovascular:     Rate and Rhythm: Bradycardia present. Rhythm irregular.  Pulmonary:     Effort: Respiratory distress present.     Breath sounds: Rhonchi present.  Abdominal:     Tenderness: There is no abdominal tenderness.  Musculoskeletal:        General: No tenderness.  Skin:    Coloration: Skin is not pale.  Neurological:     Mental Status: He is unresponsive.     GCS: GCS eye subscore is 1. GCS verbal subscore is 1. GCS motor subscore is 1.     Comments: GCS 3.       ED Treatments / Results  Labs (all labs ordered are listed, but only abnormal results are displayed) Labs Reviewed  CBC WITH DIFFERENTIAL/PLATELET - Abnormal; Notable for the following components:      Result Value   WBC 13.8 (*)    MCHC 29.9 (*)    RDW 16.0 (*)    Neutro Abs 9.1 (*)  Abs Immature Granulocytes 0.73 (*)    All other components within normal limits  LACTIC ACID, PLASMA - Abnormal; Notable for the following components:   Lactic Acid, Venous 9.0 (*)    All other components within normal limits  BASIC METABOLIC PANEL - Abnormal; Notable for the following components:   Potassium 2.7 (*)    CO2 21 (*)    Glucose, Bld 244 (*)    Creatinine, Ser 1.75 (*)    Calcium 8.3 (*)    GFR calc non Af Amer 35 (*)    GFR calc Af Amer 41 (*)    Anion gap 16 (*)    All other components within normal limits  POCT I-STAT EG7 - Abnormal; Notable for the following components:   pH, Ven 7.087 (*)    pCO2, Ven 72.7 (*)    Acid-base deficit 10.0 (*)    Potassium 2.6 (*)    Calcium, Ion 1.02 (*)    All other components within normal limits  I-STAT TROPONIN, ED    EKG None  Radiology No  results found.  Procedures Procedures (including critical care time)  CRITICAL CARE Performed by: Canary Brimhristopher J Tegeler Total critical care time: 75 minutes Critical care time was exclusive of separately billable procedures and treating other patients. Critical care was necessary to treat or prevent imminent or life-threatening deterioration. Critical care was time spent personally by me on the following activities: development of treatment plan with patient and/or surrogate as well as nursing, discussions with consultants, evaluation of patient's response to treatment, examination of patient, obtaining history from patient or surrogate, ordering and performing treatments and interventions, ordering and review of laboratory studies, ordering and review of radiographic studies, pulse oximetry and re-evaluation of patient's condition.    Cardiopulmonary Resuscitation (CPR) Procedure Note Directed/Performed by: Canary Brimhristopher J Tegeler I personally directed ancillary staff and/or performed CPR in an effort to regain return of spontaneous circulation and to maintain cardiac, neuro and systemic perfusion.    Medications Ordered in ED Medications  EPINEPHrine (ADRENALIN) 4 mg in dextrose 5 % 250 mL (0.016 mg/mL) infusion (has no administration in time range)  potassium chloride 10 mEq in 100 mL IVPB (has no administration in time range)  tenecteplase (TNKASE) injection 50 mg (has no administration in time range)  norepinephrine (LEVOPHED) 4mg  in NS 250mL premix infusion (has no administration in time range)  EPINEPHrine (ADRENALIN) 1 MG/10ML injection (1 mg Intravenous Given 2018-08-02 1220)  calcium chloride injection (1 g Intravenous Given 2018-08-02 1109)  sodium bicarbonate injection (100 mEq Intravenous Given 2018-08-02 1229)  atropine injection (1 mg Intravenous Given 2018-08-02 1141)     Initial Impression / Assessment and Plan / ED Course  I have reviewed the triage vital signs and the nursing  notes.  Pertinent labs & imaging results that were available during my care of the patient were reviewed by me and considered in my medical decision making (see chart for details).     Lucas [name] is a 85 y.o. male with a past medical history significant for CHF, A. fib on Eliquis, hypertension, and prior TIA who presents as a cardiac arrest.  Patient reportedly told family he was feeling ill and had some shortness of breath.  He called EMS who found him short of breath.  As they were transferring him to the truck, patient had hemoptysis and went to cardiac arrest.  Patient had approximately 5 doses of epinephrine and CPR for PEA arrest.  Patient had a King airway placed  in the field and was brought in.  They achieved ROSC in route and patient was not receiving chest compressions on arrival.  Due to the report of initial shortness of breath, hemoptysis, and the cardiac arrest we have a high suspicion for a large pulmonary embolism contributing to the cardiac arrest.  Patient was being evaluated and was still unresponsive with a GCS of 3.  Patient was intubated without difficulty and breath sounds are equal bilaterally.  Patient began having laboratory testing drawn and bradycardia down and went pulseless.  CPR resumed.  Patient initially received epinephrine, atropine, sodium bicarb, and calcium.  Given the concern for cardiac arrest with possible pulmonary embolism, the TPA was given.  CPR continued for approximately 10 minutes with the TPA circulating and the patient had return of spontaneous regulation again.  EKG following CPR showed complete heart block.  Cardiology was called who came to the bedside.  Patient was then paced externally for a period of time.  Patient had several more episodes of pulselessness requiring CPR.  Pulses returned.  Patient ended up being on an epinephrine drip as well as a Levophed drip.  Patient received more sodium bicarb.  Atropine was given.  While  awaiting further management, patient started to have some swelling of the left chest.  Breath sounds were still equal bilaterally however there is concern for possible pneumothorax or subcutaneous air in the setting of the extended CPR.  Critical care came to bedside and placed a chest tube.  After extensive discussions with the family and the cardiology team and critical care team, they do not feel that the patient will survive.  Family was able to spend time with the patient and be at the bedside until pacing was stopped and patient was found to have asystole.  Patient had no respirations.  Time of death called at 1254.  Medical examiner was called who did not feel this patient was in any case.  Patient's PCP was called and I spoke with Dr. Odetta Pinkerek Richardson who is the PCP and will follow that certificate.  Family was at the bedside at time of death and they were aware of his passing.   Final Clinical Impressions(s) / ED Diagnoses   Final diagnoses:  Cardiac arrest (HCC)  Complete heart block (HCC)  Shortness of breath  Hemoptysis    ED Discharge Orders    None      Clinical Impression: 1. Cardiac arrest (HCC)   2. Complete heart block (HCC)   3. Shortness of breath   4. Hemoptysis     Disposition: Expired  This note was prepared with assistance of Dragon voice recognition software. Occasional wrong-word or sound-a-like substitutions may have occurred due to the inherent limitations of voice recognition software.     Tegeler, Canary Brimhristopher J, MD 02-06-2019 [phone]

## 2018-07-08 NOTE — ED Notes (Signed)
Keith-(Medical Examiner)-called @ 1259-per Dr. Tegeler-called by Marylene Land

## 2018-07-08 NOTE — Progress Notes (Signed)
Pt expired per MD. Pt taken off vent but ETT remains pending ME notification. RT will continue to monitor for removal if necessary

## 2018-07-08 NOTE — ED Notes (Addendum)
Pulse check -PEA , 1110 pulse check ROSC rate 55  1111 149/68 rate 76  1116 pacer on map 88 rate 70 underlying asystole 1123 epip drip started at 0.5 mcg.  1127 pulse check , no pulse cpr continued 1133 pulse check  No pulse CPR 1136 pulse check , no pulse cpr 1137 son at bedside Dr. Rush Landmark at bedside speaking with family 1139 dr. Rush Landmark pulse check rosc rate 50 96/76 1142 Grandson at bedside. Pacer on Ma 106 rate 70 1146 K 10 meq up to infuse over 1 hour 1150 Cards at bedside Dr. Herbie Baltimore 1150 heartrate decreased to 50 pacer increased to 70 1211 68/37 levophed up at 10 mg 1214 71/56 Dr. Herbie Baltimore able to detect pulse with u/s 1215 CC at bedside.  1217  Pnx. Dr.; Benjamine Sprague decompressed left chest . 1219 50/25 no pulse CPR started 1220 Dr. Rush Landmark at bedside. Able to detect pulse 1223 epip drip increased to 20 Dr. Gala Romney at bedside 1224 CT inserted left chest 50/25.  1228 levophed drip increased to 40 1254 All efforts unsuccessful , son at bedside and wanted all machines removed , Dr. Rush Landmark at bedside . Patient pronounced.

## 2018-07-08 NOTE — ED Triage Notes (Signed)
Patient presents to ed via [facility] states patient was sitting in his car and called ems with c/o sob. Marland Kitchen Ems states upon their arrival patient was coughing up blood , was placed in the back of the ems truck ems states his heart rate dropped and he went into a PEA rhythm EMS started cpr at 1034 return of pulses 1052. Patient was given epip x 5 King airway inplace , IO left tib.

## 2018-07-08 NOTE — Progress Notes (Signed)
Family shared they would be using [employer] but not cremation.   Gave to nurse Tresa Endo. Chaplain Ray had been with them and completed everything else. Phebe Colla, Chaplain   July 22, 2018 1400  Clinical Encounter Type  Visited With Family  Visit Type Follow-up;Other (Comment) (get funeral home info)  Referral From Chaplain  Consult/Referral To Chaplain  Spiritual Encounters  Spiritual Needs Other (Comment) (funeral home provider needed for nurse)     July 22, 2018 1400  Clinical Encounter Type  Visited With Family  Visit Type Follow-up;Other (Comment) (get funeral home info)  Referral From Chaplain  Consult/Referral To Chaplain  Spiritual Encounters  Spiritual Needs Other (Comment) (funeral home provider needed for nurse)

## 2018-07-08 NOTE — ED Notes (Signed)
Patient intubated with 7.5 25 at lip per Dr. Rush Landmark, breath sounds bilaerally coarse rhonchi.  1101a- no pulse Cpr started  1102 am  Epip x 1  1104 Rate PEA  28 , cpr continued epip x 1  1106 TNK 50 mg pushed

## 2018-07-08 NOTE — Progress Notes (Signed)
Called by EDP for patient in active cardiac arrest with intermittent ROSC.  Patient is on 20 mcg of epi and 40 of norepi and fully externally paced.  Pulse capture is intermittent at best.  Patient is completely unresponsive with no respiratory drive.  On exam, the left chest is much higher with evidence of SQ air.  While being examined the patient became asystolic and CPR was started.  Epi was given and 2 amps of bicarb were given with intermittent ROSC.  Patient remained profoundly hypotensive.  A chest tube was placed during the arrest due to asystole and concern for tension PTX on the left since no sliding lung sign was noted on U/S.  After multiple resuscitative efforts and failure to capture and with consultation with cardiology (Dr. Herbie Baltimore and Dr. Clarise Cruz) and EDP (Dr. Emeline General) it was evident that the patient will never be stable enough to have a temporary wire placed and certainly down time was so long that meaningful recovery will be very unlikely.  Based on that, we approached the family and informed them that the patient will not recover from current condition.  After a long discussion, the son understood and was agreeable with no further resuscitation.  We recommended that the family goes to see the patient and say their goodbyes as there is not chance at recovery here.  This was relayed to the EDP who will take over the withdrawal process and I will make the patient DNR in the agreement of Dr. Herbie Baltimore, Benshimon and Teigler as there are no further interventions to be helpful here.  PCCM will sign off.  The patient is critically ill with multiple organ systems failure and requires high complexity decision making for assessment and support, frequent evaluation and titration of therapies, application of advanced monitoring technologies and extensive interpretation of multiple databases.   Critical Care Time devoted to patient care services described in this note is  38  Minutes. This time reflects  time of care of this signee Dr Koren Bound. This critical care time does not reflect procedure time, or teaching time or supervisory time of PA/NP/Med student/Med Resident etc but could involve care discussion time.  Alyson Reedy, M.D. [facility] Pulmonary/Critical Care Medicine. Pager: [phone]. After hours pager: [phone].

## 2018-07-08 DEATH — deceased

## 2018-09-19 ENCOUNTER — Ambulatory Visit: Payer: Medicare Other | Admitting: Endocrinology

## 2019-07-09 DEATH — deceased

## 2020-06-08 IMAGING — DX DG CHEST 2V
2 series · 2 of 2 positions shown · non-contrast
Comparison: 03/23/2017.

CLINICAL DATA: Cough and congestion.

EXAM:
CHEST - 2 VIEW

[dg chest 2 view (1 of 2)]
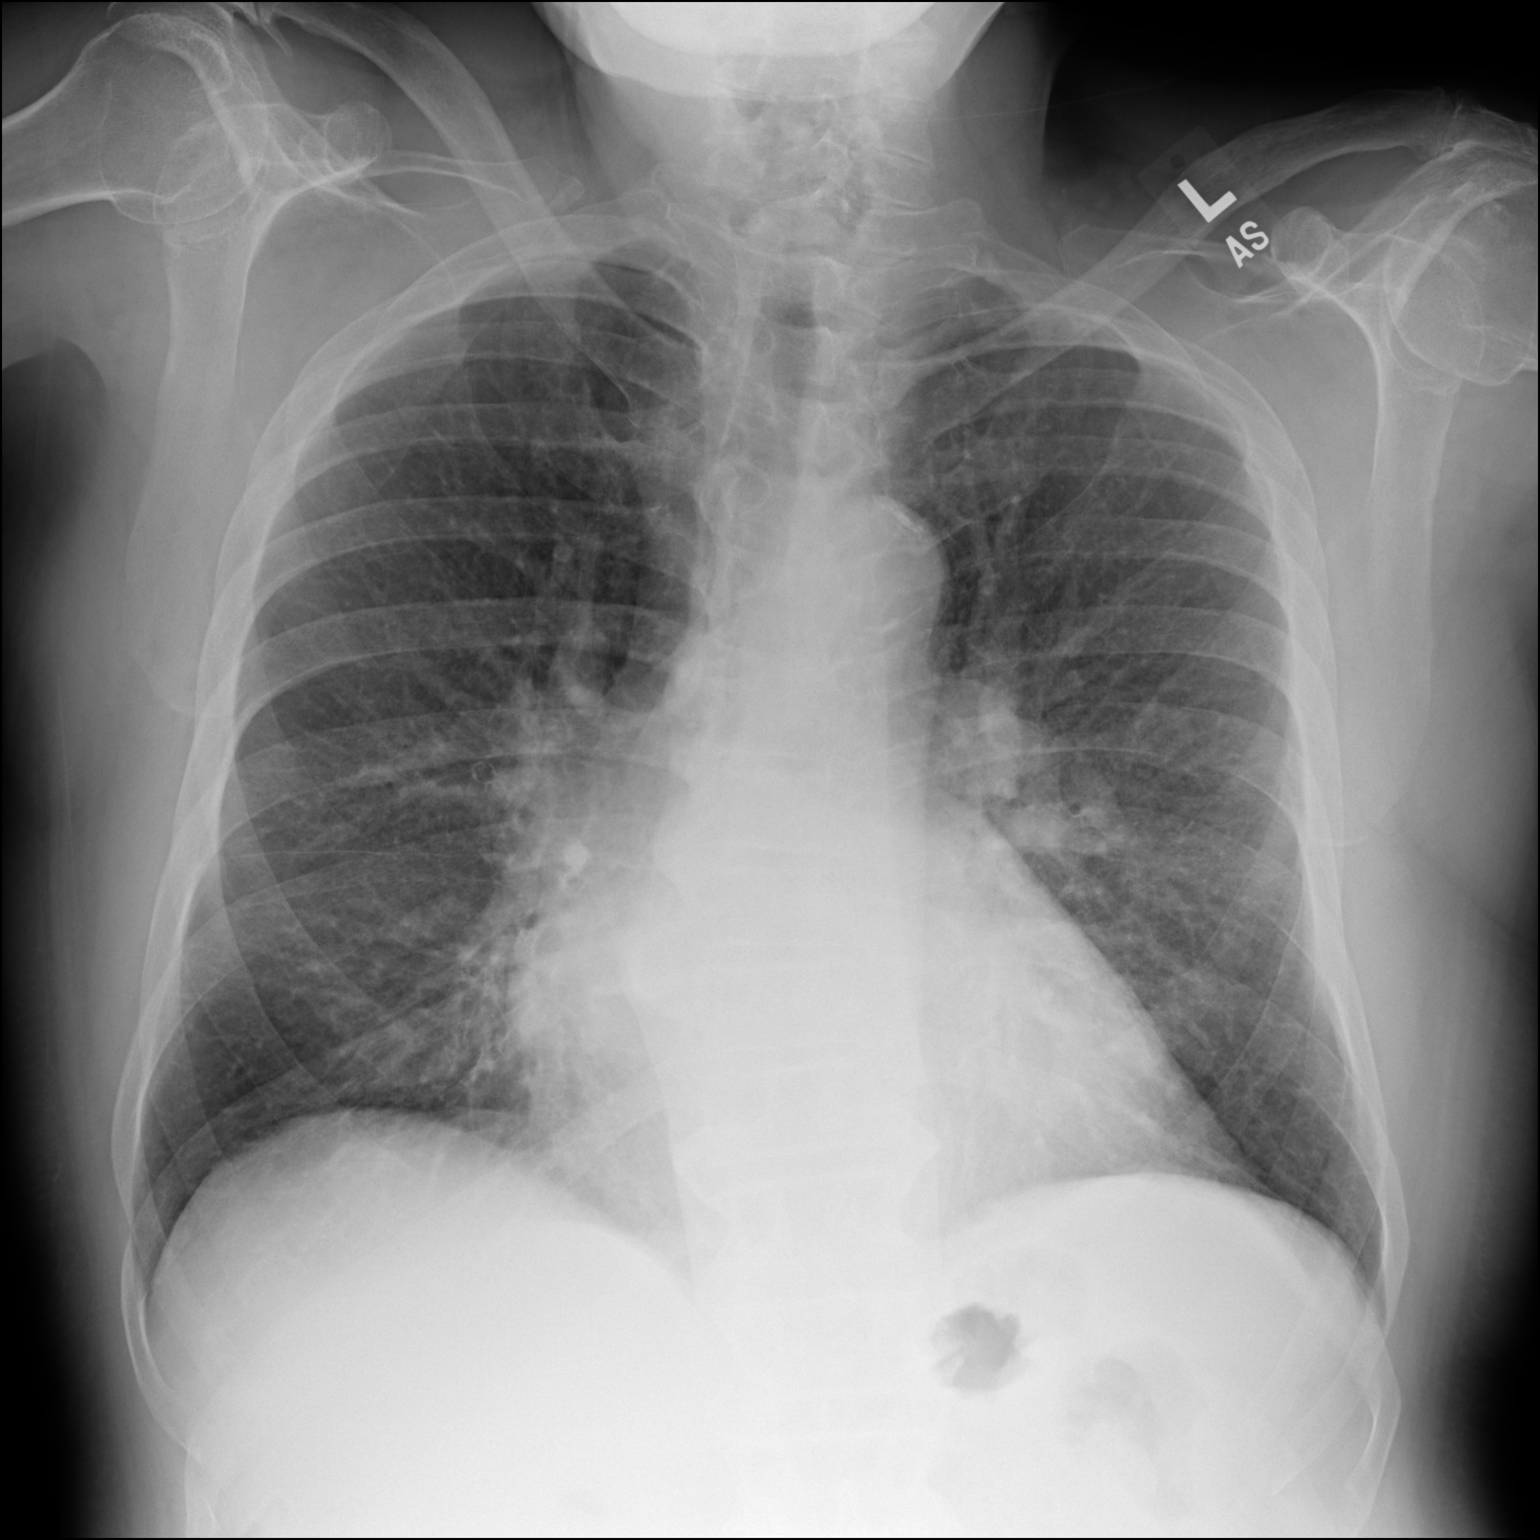

[dg chest 2 view (2 of 2)]
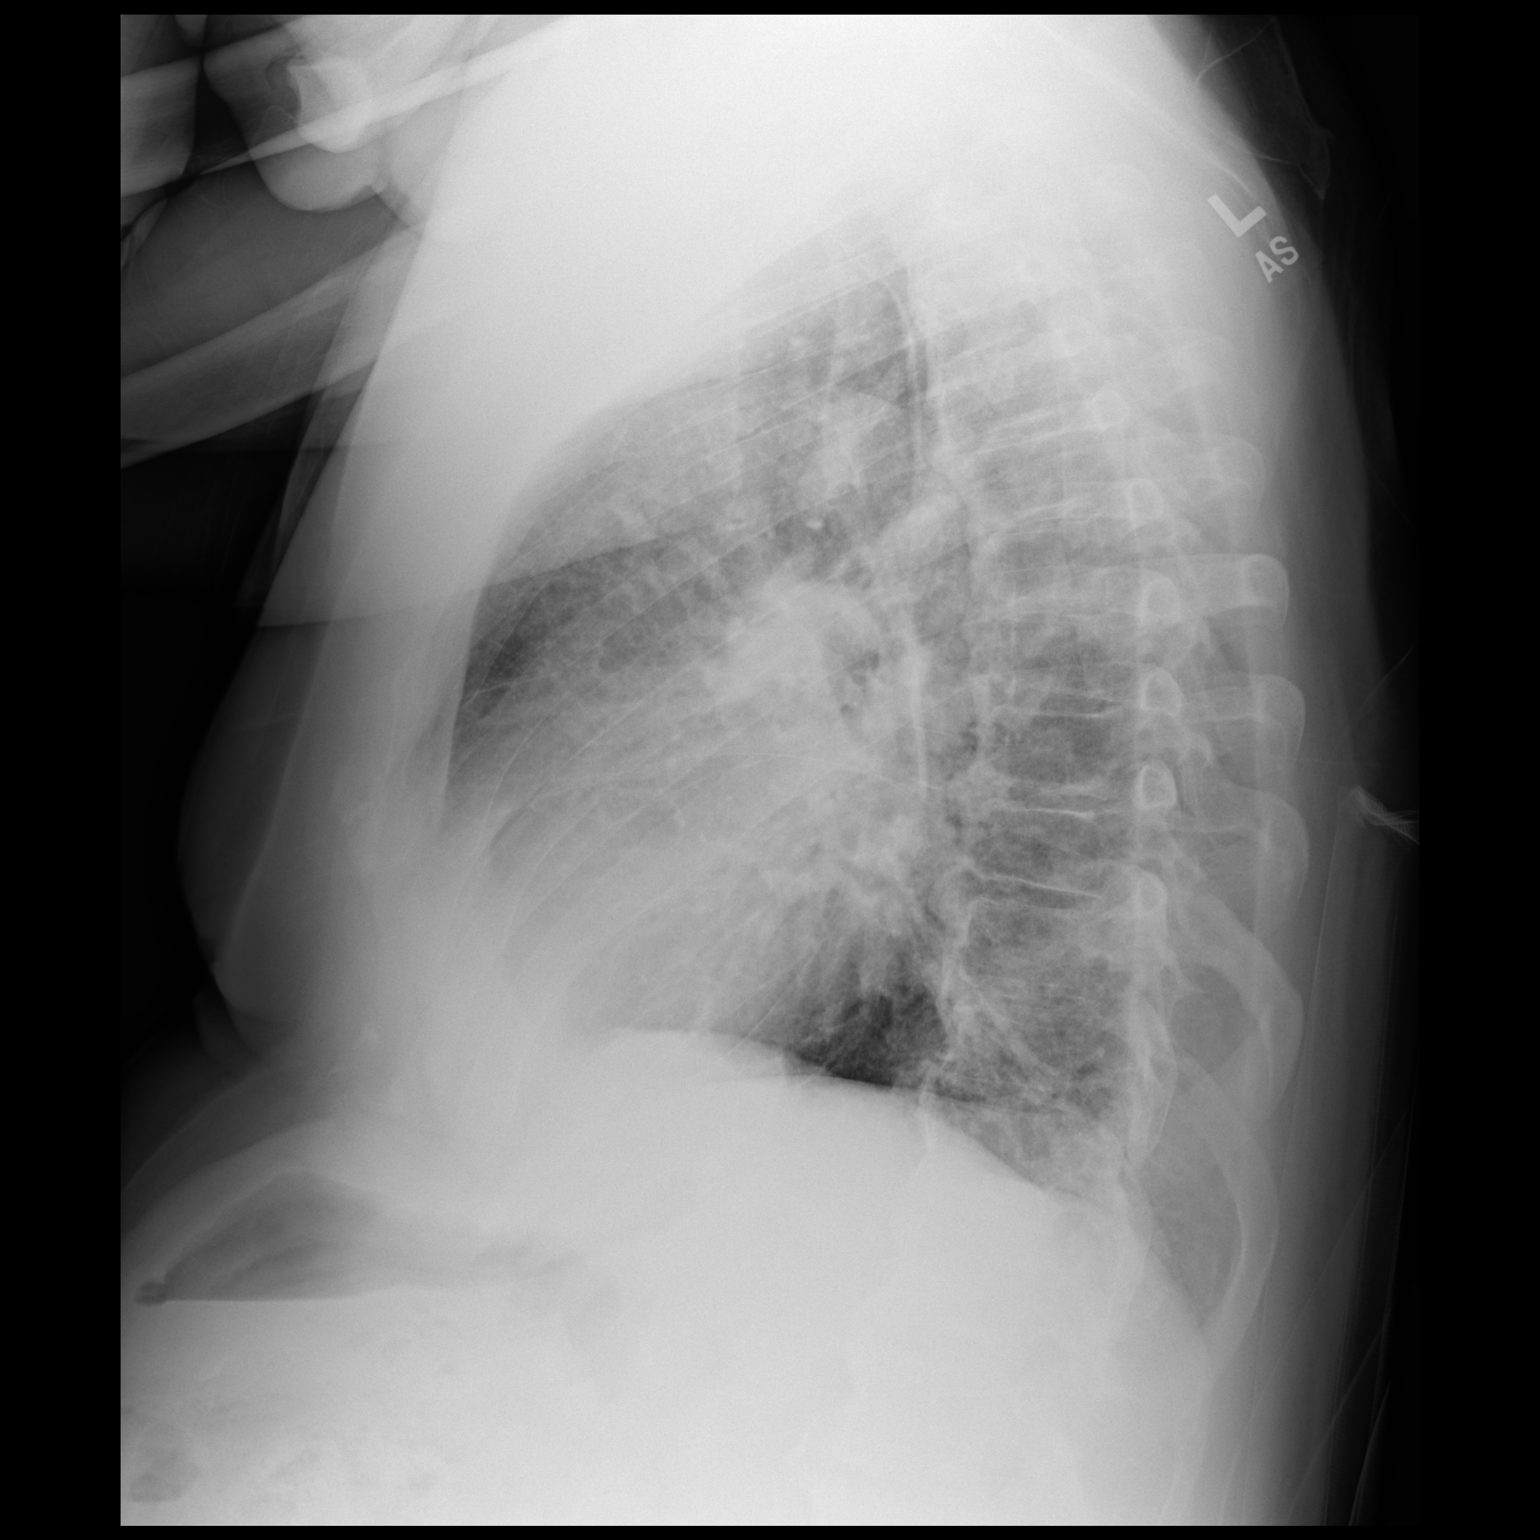

[2 of 2 positions shown; findings below may reference images not displayed]

FINDINGS: Mediastinum hilar structures normal. Cardiomegaly scratched it
stable cardiomegaly. Mild pulmonary venous congestion with mild
bilateral interstitial prominence. Mild component of CHF may be
present. No pleural effusion or pneumothorax. Degenerative change
thoracic spine with thoracic spine scoliosis again noted.
IMPRESSION: Cardiomegaly with pulmonary venous congestion and mild bilateral
interstitial prominence suggesting mild CHF.
